# Patient Record
Sex: Female | Born: 1980 | Hispanic: No | Marital: Single | Smoking: Never smoker
Health system: Southern US, Community
[De-identification: ages and names within clinical notes are randomized; demographics above are authoritative.]

## PROBLEM LIST (undated history)

## (undated) DIAGNOSIS — F329 Major depressive disorder, single episode, unspecified: Secondary | ICD-10-CM

## (undated) DIAGNOSIS — F419 Anxiety disorder, unspecified: Secondary | ICD-10-CM

## (undated) DIAGNOSIS — F32A Depression, unspecified: Secondary | ICD-10-CM

## (undated) DIAGNOSIS — F909 Attention-deficit hyperactivity disorder, unspecified type: Secondary | ICD-10-CM

## (undated) HISTORY — DX: Anxiety disorder, unspecified: F41.9

## (undated) HISTORY — PX: WISDOM TOOTH EXTRACTION: SHX21

---

## 2005-08-30 ENCOUNTER — Ambulatory Visit: Payer: Self-pay | Admitting: Sports Medicine

## 2005-09-20 ENCOUNTER — Ambulatory Visit: Payer: Self-pay | Admitting: Sports Medicine

## 2005-11-22 ENCOUNTER — Ambulatory Visit: Payer: Self-pay | Admitting: Sports Medicine

## 2006-06-02 ENCOUNTER — Encounter (INDEPENDENT_AMBULATORY_CARE_PROVIDER_SITE_OTHER): Payer: Self-pay | Admitting: Family Medicine

## 2006-06-02 ENCOUNTER — Ambulatory Visit: Payer: Self-pay | Admitting: Sports Medicine

## 2006-06-02 ENCOUNTER — Telehealth: Payer: Self-pay | Admitting: *Deleted

## 2006-06-02 DIAGNOSIS — J029 Acute pharyngitis, unspecified: Secondary | ICD-10-CM | POA: Insufficient documentation

## 2006-06-02 DIAGNOSIS — M62838 Other muscle spasm: Secondary | ICD-10-CM | POA: Insufficient documentation

## 2006-06-02 LAB — CONVERTED CEMR LAB: Rapid Strep: NEGATIVE

## 2006-07-04 ENCOUNTER — Ambulatory Visit: Payer: Self-pay | Admitting: Sports Medicine

## 2006-07-04 DIAGNOSIS — M6740 Ganglion, unspecified site: Secondary | ICD-10-CM | POA: Insufficient documentation

## 2006-07-04 DIAGNOSIS — M79609 Pain in unspecified limb: Secondary | ICD-10-CM | POA: Insufficient documentation

## 2006-07-04 DIAGNOSIS — M2140 Flat foot [pes planus] (acquired), unspecified foot: Secondary | ICD-10-CM | POA: Insufficient documentation

## 2006-07-04 DIAGNOSIS — M25579 Pain in unspecified ankle and joints of unspecified foot: Secondary | ICD-10-CM | POA: Insufficient documentation

## 2006-07-08 ENCOUNTER — Telehealth: Payer: Self-pay | Admitting: Sports Medicine

## 2006-07-09 ENCOUNTER — Telehealth: Payer: Self-pay | Admitting: *Deleted

## 2006-07-18 ENCOUNTER — Ambulatory Visit: Payer: Self-pay | Admitting: Sports Medicine

## 2006-07-18 DIAGNOSIS — R234 Changes in skin texture: Secondary | ICD-10-CM | POA: Insufficient documentation

## 2006-07-18 LAB — CONVERTED CEMR LAB
Albumin: 4.4 g/dL (ref 3.5–5.2)
Total Bilirubin: 0.4 mg/dL (ref 0.3–1.2)

## 2006-10-31 ENCOUNTER — Encounter: Admission: RE | Admit: 2006-10-31 | Discharge: 2006-10-31 | Payer: Self-pay | Admitting: Family Medicine

## 2006-11-07 ENCOUNTER — Ambulatory Visit: Payer: Self-pay | Admitting: Sports Medicine

## 2006-11-07 DIAGNOSIS — M84369A Stress fracture, unspecified tibia and fibula, initial encounter for fracture: Secondary | ICD-10-CM | POA: Insufficient documentation

## 2006-11-20 ENCOUNTER — Other Ambulatory Visit: Admission: RE | Admit: 2006-11-20 | Discharge: 2006-11-20 | Payer: Self-pay | Admitting: Family Medicine

## 2006-11-21 ENCOUNTER — Ambulatory Visit: Payer: Self-pay | Admitting: Sports Medicine

## 2007-08-04 ENCOUNTER — Ambulatory Visit: Payer: Self-pay | Admitting: Sports Medicine

## 2007-08-04 DIAGNOSIS — M25569 Pain in unspecified knee: Secondary | ICD-10-CM | POA: Insufficient documentation

## 2007-11-13 ENCOUNTER — Other Ambulatory Visit: Admission: RE | Admit: 2007-11-13 | Discharge: 2007-11-13 | Payer: Self-pay | Admitting: Family Medicine

## 2007-12-22 ENCOUNTER — Emergency Department (HOSPITAL_COMMUNITY): Admission: EM | Admit: 2007-12-22 | Discharge: 2007-12-22 | Payer: Self-pay | Admitting: Emergency Medicine

## 2008-11-05 ENCOUNTER — Encounter (INDEPENDENT_AMBULATORY_CARE_PROVIDER_SITE_OTHER): Payer: Self-pay | Admitting: *Deleted

## 2008-11-05 DIAGNOSIS — F17200 Nicotine dependence, unspecified, uncomplicated: Secondary | ICD-10-CM | POA: Insufficient documentation

## 2008-11-18 ENCOUNTER — Other Ambulatory Visit: Admission: RE | Admit: 2008-11-18 | Discharge: 2008-11-18 | Payer: Self-pay | Admitting: Family Medicine

## 2009-12-12 ENCOUNTER — Other Ambulatory Visit: Admission: RE | Admit: 2009-12-12 | Discharge: 2009-12-12 | Payer: Self-pay | Admitting: Family Medicine

## 2010-03-27 NOTE — Assessment & Plan Note (Signed)
Summary: shin pain despite orthotic changes last visit   Vital Signs:  Patient Profile:   30 Years Old Female Pulse rate:   60 / minute BP sitting:   112 / 65  Vitals Entered By: Lillia Pauls CMA (Jul 18, 2006 10:24 AM)               Chief Complaint:  shin pain despite orthotic changes last visit.  History of Present Illness: Since the corrections to her orthrotics, the pain along the medial border of the anterior tibia has worsened.  She also reports pain with plantarflexion in the area of the medial soleus.    Runs approximately 7 miles tow to three times a week.  Is now having pain in these areas within 2 minutes of running.   However, she denies any paresthesias, numbness, or weakness in the distal right leg.   The pain is described as a dull ache that progresses in intensity with weight bearing exercise.  Note the only change in training was some increase in her running frequiency;  seh does spin with lisa barella and does a number of other exercise regimens including some weights at times.        Physical Exam  General:     Knee with mild genu valgus.   Foot is average arch with some slight breakdown of longitudinal arch bilat.   transverse arch collapse is significant bilat with bilateral bunionettes.   Enlargement of MTP bilat without clear Bunions at this stage but bilaterally with callous on l. medial great toe.   small ganglion in extensor hallicus tendon over mcp-- nt and mobile.   No pain with great toe range of motion.  Gait:overpronation with excessive great toe pushoff.  this is exacerbated by turnout of Rt foot which causes more pronation on RT.  walking gait was fairly neutral.  Examination of the right shin shows tenderness to palpation at the junction of the lower third of the right tibia.  She also has pain with resisted plantarflexion and tenderness to palpation at the medial border of the right soleus.  MS [place] scan of Rt tibia did not show any  step off or fracture line.  MM and tendons appear intact along medial shin. There was 1 small area of periosteal thickening at tender area at juncture of distal and medial 1/3 of tibia on Rt       Impression & Recommendations:  Problem # 1:  LEG PAIN (ICD-729.5) Assessment: Unchanged This could be multifactorial shin splints vs tibial stress reaction vs soleus syndrome.  Will treat as shin spints with rest and rehab exercises as outlined in the instruction section.  If pain worsens, will get bone scan to rule stress fracture.  Also believe that the pain worsened due to too many corrections at one time in the previous orthotics.  Removed the large metatarsal pad and grinded down the post on the great toe bilaterally. Orders: [facility]- Est Level  3 (96295)   Other Orders: [facility] ([id number])   Patient Instructions: 1)  Do 3 sets of 15 of the following exercises everyday 2)  a) calf raises on a step 3)  b) internally rotated calf raises on a step 4)  c) big toe calf raises on a step 5)  ice the shin everyday, avoid running for the next 2 weeks 6)  do running lunges with 10 lb weight 7)  do half squats (down fast and up slow) 8)  after 2 weeks, if better, resume  running but decrease distance by 25% and slowly resume.
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
## 2013-10-07 ENCOUNTER — Other Ambulatory Visit: Payer: Self-pay | Admitting: Family Medicine

## 2013-10-07 DIAGNOSIS — N644 Mastodynia: Secondary | ICD-10-CM

## 2013-10-12 ENCOUNTER — Ambulatory Visit
Admission: RE | Admit: 2013-10-12 | Discharge: 2013-10-12 | Disposition: A | Discharge status: Home / Self Care | Payer: 59 | Source: Ambulatory Visit | Attending: Family Medicine | Admitting: Family Medicine

## 2013-10-12 DIAGNOSIS — N644 Mastodynia: Secondary | ICD-10-CM

## 2014-06-22 ENCOUNTER — Encounter: Payer: Self-pay | Admitting: Sports Medicine

## 2014-06-22 ENCOUNTER — Ambulatory Visit (INDEPENDENT_AMBULATORY_CARE_PROVIDER_SITE_OTHER): Payer: 59 | Admitting: Sports Medicine

## 2014-06-22 VITALS — BP 117/63 | Ht 65.0 in | Wt 145.0 lb

## 2014-06-22 DIAGNOSIS — M25561 Pain in right knee: Secondary | ICD-10-CM | POA: Diagnosis not present

## 2014-06-22 DIAGNOSIS — M79604 Pain in right leg: Secondary | ICD-10-CM | POA: Diagnosis not present

## 2014-06-22 NOTE — Assessment & Plan Note (Addendum)
Pes anserine region. US with hypoechoic change in Semi-T and Gracilis. Weak hip adductors on right. RLE > LLE by ~0.5cm. -Hip adductor strengthening on the right -Obtain updated cushioned footwear. -Follow-up in 6-8 weeks to check strength or sooner prn.

## 2014-06-22 NOTE — Assessment & Plan Note (Signed)
Right distal 1/3 tibial stress reaction. -Obtain updated cushioned footwear for running. -Follow-up 6-8 weeks or sooner prn
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
## 2014-07-27 ENCOUNTER — Other Ambulatory Visit: Payer: Self-pay | Admitting: Obstetrics and Gynecology

## 2014-07-28 LAB — CYTOLOGY - PAP

## 2014-08-03 ENCOUNTER — Ambulatory Visit: Payer: 59 | Admitting: Sports Medicine

## 2014-08-16 ENCOUNTER — Other Ambulatory Visit: Payer: Self-pay | Admitting: Obstetrics and Gynecology

## 2015-09-06 ENCOUNTER — Encounter: Payer: Self-pay | Admitting: Sports Medicine

## 2015-09-06 ENCOUNTER — Ambulatory Visit (INDEPENDENT_AMBULATORY_CARE_PROVIDER_SITE_OTHER): Payer: 59 | Admitting: Sports Medicine

## 2015-09-06 VITALS — BP 120/70 | Ht 65.0 in | Wt 145.0 lb

## 2015-09-06 DIAGNOSIS — M533 Sacrococcygeal disorders, not elsewhere classified: Secondary | ICD-10-CM | POA: Insufficient documentation

## 2015-09-06 NOTE — Patient Instructions (Signed)
Trunk Exercises  Lateral leans Forward rotation and dip toward knee Side planks  Hips  Keep up some abduction  Some adduction Work in the step exercises  Topical for SI Joint I like arnica gel  Ok to use aleve if needed  You have joint mice!

## 2015-09-06 NOTE — Progress Notes (Signed)
Patient ID: Oletta Dartermanda Gravatt, female   DOB: 05-May-1980, 35 y.o.   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

## 2015-09-06 NOTE — Assessment & Plan Note (Signed)
We will use a conservative program  Focus on trunk MM stabilization since hip MM seem strong  Give this 6 to 8 weeks to see if we can tell if she is having progress  Reassess if not

## 2016-01-20 ENCOUNTER — Encounter (HOSPITAL_COMMUNITY): Payer: Self-pay | Admitting: *Deleted

## 2016-01-20 ENCOUNTER — Emergency Department (HOSPITAL_COMMUNITY): Payer: Worker's Compensation

## 2016-01-20 ENCOUNTER — Emergency Department (HOSPITAL_COMMUNITY)
Admission: EM | Admit: 2016-01-20 | Discharge: 2016-01-20 | Disposition: A | Discharge status: Home / Self Care | Payer: Worker's Compensation | Attending: Emergency Medicine | Admitting: Emergency Medicine

## 2016-01-20 DIAGNOSIS — S60410A Abrasion of right index finger, initial encounter: Secondary | ICD-10-CM | POA: Diagnosis not present

## 2016-01-20 DIAGNOSIS — S60052A Contusion of left little finger without damage to nail, initial encounter: Secondary | ICD-10-CM | POA: Insufficient documentation

## 2016-01-20 DIAGNOSIS — T148XXA Other injury of unspecified body region, initial encounter: Secondary | ICD-10-CM

## 2016-01-20 DIAGNOSIS — F909 Attention-deficit hyperactivity disorder, unspecified type: Secondary | ICD-10-CM | POA: Insufficient documentation

## 2016-01-20 DIAGNOSIS — Y939 Activity, unspecified: Secondary | ICD-10-CM | POA: Insufficient documentation

## 2016-01-20 DIAGNOSIS — S60222A Contusion of left hand, initial encounter: Secondary | ICD-10-CM

## 2016-01-20 DIAGNOSIS — Y990 Civilian activity done for income or pay: Secondary | ICD-10-CM | POA: Insufficient documentation

## 2016-01-20 DIAGNOSIS — W228XXA Striking against or struck by other objects, initial encounter: Secondary | ICD-10-CM | POA: Diagnosis not present

## 2016-01-20 DIAGNOSIS — S6992XA Unspecified injury of left wrist, hand and finger(s), initial encounter: Secondary | ICD-10-CM | POA: Diagnosis present

## 2016-01-20 DIAGNOSIS — Y9239 Other specified sports and athletic area as the place of occurrence of the external cause: Secondary | ICD-10-CM | POA: Diagnosis not present

## 2016-01-20 HISTORY — DX: Depression, unspecified: F32.A

## 2016-01-20 HISTORY — DX: Attention-deficit hyperactivity disorder, unspecified type: F90.9

## 2016-01-20 HISTORY — DX: Major depressive disorder, single episode, unspecified: F32.9

## 2016-01-20 NOTE — ED Triage Notes (Signed)
Patient is alert and oriented x4.  She is complaining of a laceration to the right index finger with a secondary injury to the left hand while working out at the gym.  Currently she rates her pain 3 of 10.

## 2016-01-20 NOTE — Discharge Instructions (Signed)
Please read attached information. If you experience any new or worsening signs or symptoms please return to the emergency room for evaluation. Please follow-up with your primary care provider or specialist as discussed.  °

## 2016-01-20 NOTE — ED Notes (Signed)
Patient is alert and oriented x3.  She was given DC instructions and follow up visit instructions.  Patient gave verbal understanding. She was DC ambulatory under her own power to home.  V/S stable.  He was not showing any signs of distress on DC 
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
## 2016-01-20 NOTE — ED Notes (Signed)
Patient has a small skin abrasion to the right index finger.   band aide placed over abrasion.  No bleeding noted

## 2016-12-27 ENCOUNTER — Ambulatory Visit: Payer: Self-pay | Admitting: Physician Assistant

## 2016-12-28 ENCOUNTER — Ambulatory Visit (INDEPENDENT_AMBULATORY_CARE_PROVIDER_SITE_OTHER): Payer: Worker's Compensation | Admitting: Family Medicine

## 2016-12-28 ENCOUNTER — Ambulatory Visit (INDEPENDENT_AMBULATORY_CARE_PROVIDER_SITE_OTHER): Payer: Worker's Compensation

## 2016-12-28 ENCOUNTER — Encounter: Payer: Self-pay | Admitting: Family Medicine

## 2016-12-28 VITALS — BP 122/68 | HR 85 | Temp 98.3°F | Resp 16 | Ht 65.35 in | Wt 138.0 lb

## 2016-12-28 DIAGNOSIS — Y990 Civilian activity done for income or pay: Secondary | ICD-10-CM

## 2016-12-28 DIAGNOSIS — S93601A Unspecified sprain of right foot, initial encounter: Secondary | ICD-10-CM | POA: Diagnosis not present

## 2016-12-28 DIAGNOSIS — M79671 Pain in right foot: Secondary | ICD-10-CM | POA: Diagnosis not present

## 2016-12-28 DIAGNOSIS — S99921A Unspecified injury of right foot, initial encounter: Secondary | ICD-10-CM

## 2016-12-28 NOTE — Patient Instructions (Signed)
     IF you received an x-ray today, you will receive an invoice from [facility]. Please contact [facility] at [phone] with questions or concerns regarding your invoice.   IF you received labwork today, you will receive an invoice from [facility]. Please contact [facility] at [phone] with questions or concerns regarding your invoice.   Our billing staff will not be able to assist you with questions regarding bills from these companies.  You will be contacted with the lab results as soon as they are available. The fastest way to get your results is to activate your My Chart account. Instructions are located on the last page of this paperwork. If you have not heard from us regarding the results in 2 weeks, please contact this office.     

## 2016-12-28 NOTE — Progress Notes (Signed)
Subjective:     Patient ID: Kathy Vaughan, female   DOB: [birth year], 36 y.o.   MRN: [id number]  HPI This 36 y.o. female presents for RIGHT FOOT injury at work on 12/26/2016.  Stepped back onto equipment at employment and twisted right foot.  Having diffuse right foot pain with swelling and bruising.  Pain with weightbearing.  Has identified a specific shoe that is tolerable to wear during work hours.  Denies numbness, tingling, weakness of foot.  Has been icing the foot and elevating it.  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  Usually jogs every day after work on her personal time.  No previous injury to the foot.  Iced and elevated; resting; sitting as much as possible; compression.   No Aleve.  Review of Systems  Constitutional: Negative for chills, diaphoresis, fatigue and fever.  HENT: Negative for ear pain, postnasal drip, rhinorrhea, sinus pressure, sore throat and trouble swallowing.   Respiratory: Negative for cough and shortness of breath.   Cardiovascular: Negative for chest pain, palpitations and leg swelling.  Gastrointestinal: Negative for abdominal pain, constipation, diarrhea, nausea and vomiting.  Musculoskeletal: Positive for arthralgias, gait problem and joint swelling.  Neurological: Negative for weakness and numbness.       Objective:   Physical Exam  Constitutional: She is oriented to person, place, and time. She appears well-developed and well-nourished. No distress.  HENT:  Head: Normocephalic and atraumatic.  Eyes: Pupils are equal, round, and reactive to light. Conjunctivae are normal.  Neck: Normal range of motion. Neck supple.  Cardiovascular: Normal rate, regular rhythm and normal heart sounds.  Exam reveals no gallop and no friction rub.   No murmur heard. Pulmonary/Chest: Effort normal and breath sounds normal. She has no wheezes. She has no rales.  Musculoskeletal:       Right ankle: She exhibits  normal range of motion, no swelling and no ecchymosis. No tenderness. No lateral malleolus, no medial malleolus and no AITFL tenderness found. Achilles tendon normal. Achilles tendon exhibits no pain, no defect and normal Thompson's test results.       Right lower leg: Normal. She exhibits no tenderness, no bony tenderness and no swelling.       Right foot: There is tenderness, bony tenderness and swelling. There is normal range of motion, normal capillary refill, no crepitus, no deformity and no laceration.  Mild swelling of proximal foot RIGHT.  Painful ROM in all directions.  Neurological: She is alert and oriented to person, place, and time.  Skin: She is not diaphoretic.  Psychiatric: She has a normal mood and affect. Her behavior is normal.  Nursing note and vitals reviewed.  No results found.    Assessment:     1. Right foot pain   2. Sprain of right foot, initial encounter   3. Foot injury, right, initial encounter   4. Work related injury        Plan:   New work-related injury. Recommend rest, icing, elevation foot. Recommend supportive tennis shoe while working. Avoid prolonged standing on right foot for the next week.  Avoid jogging, jumping on right foot for the next week. Light duty recommended with specific restrictions outlined in note. X-ray negative for acute fracture. Return to clinic in 2 weeks for reevaluation and sooner if symptoms worsen.     Orders Placed This Encounter  Procedures  . DG Foot Complete Right    No orders of the  defined types were placed in this encounter.   Marlynn Hinckley Paulita FujitaMartin Delora Gravatt, M.D. [facility] at [facility]  [place] [facility] [address] [place], [place]  [id number] [phone] phone [phone] fax

## 2017-01-13 ENCOUNTER — Other Ambulatory Visit: Payer: Self-pay

## 2017-01-13 ENCOUNTER — Encounter: Payer: Self-pay | Admitting: Family Medicine

## 2017-01-13 ENCOUNTER — Ambulatory Visit (INDEPENDENT_AMBULATORY_CARE_PROVIDER_SITE_OTHER): Payer: Worker's Compensation | Admitting: Family Medicine

## 2017-01-13 VITALS — BP 118/64 | HR 94 | Temp 98.4°F | Resp 16 | Ht 66.14 in | Wt 135.0 lb

## 2017-01-13 DIAGNOSIS — Y990 Civilian activity done for income or pay: Secondary | ICD-10-CM | POA: Diagnosis not present

## 2017-01-13 DIAGNOSIS — S9031XD Contusion of right foot, subsequent encounter: Secondary | ICD-10-CM | POA: Diagnosis not present

## 2017-01-13 NOTE — Patient Instructions (Signed)
     IF you received an x-ray today, you will receive an invoice from [facility]. Please contact [facility] at [phone] with questions or concerns regarding your invoice.   IF you received labwork today, you will receive an invoice from [facility]. Please contact [facility] at [phone] with questions or concerns regarding your invoice.   Our billing staff will not be able to assist you with questions regarding bills from these companies.  You will be contacted with the lab results as soon as they are available. The fastest way to get your results is to activate your My Chart account. Instructions are located on the last page of this paperwork. If you have not heard from us regarding the results in 2 weeks, please contact this office.     

## 2017-01-13 NOTE — Progress Notes (Signed)
   Subjective:    Patient ID: Kathy Vaughan, female    DOB: [birth year], 36 y.o.   MRN: [id number]  01/13/2017  Work Related Injury (follow-up, right foot injury )    HPI This 36 y.o. female presents for evaluation of R foot sprain.  Foot feels much better.  85% improved from last visit.  Has increased activity gradually with good tolerance.  No persistent swelling of foot. No bruising.  Feeling well.  No numbness/tingling/weakness of foot.  Gait is normal.  Ready to return to regular duty at work.    BP Readings from Last 3 Encounters:  01/13/17 118/64  12/28/16 122/68  01/20/16 111/80     There is no immunization history on file for this patient.  Review of Systems  Constitutional: Negative for chills, diaphoresis, fatigue, fever and unexpected weight change.  Musculoskeletal: Negative for arthralgias and joint swelling.  Neurological: Negative for weakness and numbness.    No Known Allergies   No family history on file.     Objective:    BP 118/64   Pulse 94   Temp 98.4 F (36.9 C) (Oral)   Resp 16   Ht 5' 6.14" (1.68 m)   Wt 135 lb (61.2 kg)   SpO2 100%   BMI 21.70 kg/m  Physical Exam  Constitutional: She is oriented to person, place, and time. She appears well-developed and well-nourished. No distress.  HENT:  Head: Normocephalic and atraumatic.  Eyes: Conjunctivae are normal. Pupils are equal, round, and reactive to light.  Neck: Normal range of motion. Neck supple.  Cardiovascular: Normal rate, regular rhythm and normal heart sounds. Exam reveals no gallop and no friction rub.  No murmur heard. Pulmonary/Chest: Effort normal and breath sounds normal. She has no wheezes. She has no rales.  Musculoskeletal:       Right ankle: Normal. She exhibits normal range of motion and no swelling. No tenderness. No lateral malleolus and no medial malleolus tenderness found. Achilles tendon normal. Achilles tendon exhibits no pain.       Right lower leg: Normal. She  exhibits no tenderness, no bony tenderness and no swelling.       Right foot: Normal. There is normal range of motion, no tenderness, no bony tenderness and no swelling.  Neurological: She is alert and oriented to person, place, and time.  Skin: She is not diaphoretic.  Psychiatric: She has a normal mood and affect. Her behavior is normal.  Nursing note and vitals reviewed.  No results found.        Assessment & Plan:   1. Contusion of right foot, subsequent encounter   2. Work related injury    -much improved and nearly resolved -return to regular duty. -follow-up PRN.   No orders of the defined types were placed in this encounter.  No orders of the defined types were placed in this encounter.   No Follow-up on file.   Tasheem Elms Paulita FujitaMartin Ellaree Gear, M.D. Primary Care at [facility] [address] [place], [place]  [id number] [phone] phone [phone] fax

## 2017-03-27 ENCOUNTER — Telehealth: Payer: Self-pay

## 2017-03-27 NOTE — Telephone Encounter (Signed)
Pt needs office visit for referral.   Copied from CRM [phone]. Topic: Referral - Request >> Mar 27, 2017 12:27 PM Landry MellowFoltz, Melissa J wrote: Reason for CRM: pt is calling to request rheumatologist.  She would like  to go to [place] [facility], Dr Orlin HildingAngela Hawks Cb # [phone]

## 2017-03-28 ENCOUNTER — Telehealth: Payer: Self-pay | Admitting: Family Medicine

## 2017-03-28 NOTE — Telephone Encounter (Signed)
Called pt to let her kow she will need an OV for a referral. She is going to call her PCP to schedule with them. If something doesn't work out there, she will call us back.  Thanks!

## 2017-03-31 ENCOUNTER — Other Ambulatory Visit: Payer: Self-pay | Admitting: Family Medicine

## 2017-03-31 ENCOUNTER — Ambulatory Visit
Admission: RE | Admit: 2017-03-31 | Discharge: 2017-03-31 | Disposition: A | Discharge status: Home / Self Care | Payer: BLUE CROSS/BLUE SHIELD | Source: Ambulatory Visit | Attending: Family Medicine | Admitting: Family Medicine

## 2017-03-31 DIAGNOSIS — R609 Edema, unspecified: Secondary | ICD-10-CM

## 2017-03-31 DIAGNOSIS — M7989 Other specified soft tissue disorders: Secondary | ICD-10-CM | POA: Diagnosis not present

## 2017-03-31 DIAGNOSIS — M2550 Pain in unspecified joint: Secondary | ICD-10-CM | POA: Diagnosis not present

## 2017-04-28 DIAGNOSIS — F3341 Major depressive disorder, recurrent, in partial remission: Secondary | ICD-10-CM | POA: Diagnosis not present

## 2017-04-28 DIAGNOSIS — F900 Attention-deficit hyperactivity disorder, predominantly inattentive type: Secondary | ICD-10-CM | POA: Diagnosis not present

## 2017-05-22 DIAGNOSIS — M545 Low back pain: Secondary | ICD-10-CM | POA: Diagnosis not present

## 2017-05-22 DIAGNOSIS — M2550 Pain in unspecified joint: Secondary | ICD-10-CM | POA: Diagnosis not present

## 2017-05-22 DIAGNOSIS — M064 Inflammatory polyarthropathy: Secondary | ICD-10-CM | POA: Diagnosis not present

## 2017-05-28 ENCOUNTER — Encounter: Payer: Self-pay | Admitting: Physician Assistant

## 2017-06-25 DIAGNOSIS — Z01419 Encounter for gynecological examination (general) (routine) without abnormal findings: Secondary | ICD-10-CM | POA: Diagnosis not present

## 2017-06-25 DIAGNOSIS — Z6822 Body mass index (BMI) 22.0-22.9, adult: Secondary | ICD-10-CM | POA: Diagnosis not present

## 2017-06-25 DIAGNOSIS — R1903 Right lower quadrant abdominal swelling, mass and lump: Secondary | ICD-10-CM | POA: Diagnosis not present

## 2017-07-01 DIAGNOSIS — R768 Other specified abnormal immunological findings in serum: Secondary | ICD-10-CM | POA: Diagnosis not present

## 2017-07-01 DIAGNOSIS — M545 Low back pain: Secondary | ICD-10-CM | POA: Diagnosis not present

## 2017-07-01 DIAGNOSIS — M2550 Pain in unspecified joint: Secondary | ICD-10-CM | POA: Diagnosis not present

## 2017-07-07 DIAGNOSIS — M898X7 Other specified disorders of bone, ankle and foot: Secondary | ICD-10-CM | POA: Diagnosis not present

## 2017-07-07 DIAGNOSIS — M21611 Bunion of right foot: Secondary | ICD-10-CM | POA: Diagnosis not present

## 2017-07-07 DIAGNOSIS — M21612 Bunion of left foot: Secondary | ICD-10-CM | POA: Diagnosis not present

## 2017-07-07 DIAGNOSIS — R2689 Other abnormalities of gait and mobility: Secondary | ICD-10-CM | POA: Diagnosis not present

## 2017-07-17 ENCOUNTER — Encounter: Payer: Self-pay | Admitting: Family Medicine

## 2017-10-21 DIAGNOSIS — F3341 Major depressive disorder, recurrent, in partial remission: Secondary | ICD-10-CM | POA: Diagnosis not present

## 2017-10-21 DIAGNOSIS — F900 Attention-deficit hyperactivity disorder, predominantly inattentive type: Secondary | ICD-10-CM | POA: Diagnosis not present

## 2018-01-18 DIAGNOSIS — L03039 Cellulitis of unspecified toe: Secondary | ICD-10-CM | POA: Diagnosis not present

## 2018-04-13 DIAGNOSIS — F3341 Major depressive disorder, recurrent, in partial remission: Secondary | ICD-10-CM | POA: Diagnosis not present

## 2018-04-13 DIAGNOSIS — F900 Attention-deficit hyperactivity disorder, predominantly inattentive type: Secondary | ICD-10-CM | POA: Diagnosis not present

## 2018-05-03 DIAGNOSIS — J029 Acute pharyngitis, unspecified: Secondary | ICD-10-CM | POA: Diagnosis not present

## 2018-05-03 DIAGNOSIS — R05 Cough: Secondary | ICD-10-CM | POA: Diagnosis not present

## 2018-05-10 DIAGNOSIS — R05 Cough: Secondary | ICD-10-CM | POA: Diagnosis not present

## 2018-05-10 DIAGNOSIS — J069 Acute upper respiratory infection, unspecified: Secondary | ICD-10-CM | POA: Diagnosis not present

## 2018-10-05 DIAGNOSIS — F3341 Major depressive disorder, recurrent, in partial remission: Secondary | ICD-10-CM | POA: Diagnosis not present

## 2018-10-18 DIAGNOSIS — R51 Headache: Secondary | ICD-10-CM | POA: Diagnosis not present

## 2018-10-18 DIAGNOSIS — Z1159 Encounter for screening for other viral diseases: Secondary | ICD-10-CM | POA: Diagnosis not present

## 2018-10-18 DIAGNOSIS — J029 Acute pharyngitis, unspecified: Secondary | ICD-10-CM | POA: Diagnosis not present

## 2018-11-06 DIAGNOSIS — R109 Unspecified abdominal pain: Secondary | ICD-10-CM | POA: Diagnosis not present

## 2019-01-29 ENCOUNTER — Ambulatory Visit: Payer: BC Managed Care – PPO | Admitting: Pediatrics

## 2019-01-29 ENCOUNTER — Other Ambulatory Visit: Payer: Self-pay

## 2019-01-29 VITALS — BP 120/78 | Ht 65.0 in | Wt 132.0 lb

## 2019-01-29 DIAGNOSIS — S99921A Unspecified injury of right foot, initial encounter: Secondary | ICD-10-CM

## 2019-01-29 NOTE — Progress Notes (Signed)
  Kathy Vaughan - 38 y.o. 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 She reports that she was trail running yesterday in minimalist shoes and rolled over a rock, immediately felt pain in her 2nd metatarsal. She has had bruising and swelling over the 2nd MT of her foot. She has compressed and elevated foot. Takes celebrex as well. Pain with weight bearing.   ROS: No instability, muscle pain, numbness/tingling, redness, otherwise see HPI   PMHx - Updated and reviewed.  Contributory factors include: Negative PSHx - Updated and reviewed.  Contributory factors include:  Negative FHx - Updated and reviewed.  Contributory factors include:  Negative Social Hx - Updated and reviewed. Contributory factors include: Negative Medications - reviewed   DATA REVIEWED: none  PHYSICAL EXAM:  VS: BP:120/78  HR: bpm  TEMP: ( )  RESP:   HT:5\' 5"  (165.1 cm)   WT:132 lb (59.9 kg)  BMI:21.97 PHYSICAL EXAM: Gen: NAD, alert, cooperative with exam, well-appearing HEENT: clear conjunctiva,  CV:  no edema, capillary refill brisk, normal rate Resp: non-labored Skin: no rashes, normal turgor  Neuro: no gross deficits.  Psych:  alert and oriented  Right foot: Foot: Inspection:  Swelling, bruising, loss of landmarks over 2nd MT Palpation: TTP over 2nd MT ROM: Full ROM of the ankle. Pain with flexion of 2nd toe. Normal midfoot flexibility Strength: 5/5 strength ankle in all planes Neurovascular: N/V intact distally in the lower extremity  Pain during pushoff phase of walking, unable to bear weight   Left foot: No deformity noted No TTP Full ROM Full strength in all planes NVI  [place]: right foot Distal 2nd MT with neovascularization and fluid cap over area of maximal tenderness. No cortical irregularity noted. Hypoechoic changes in 2nd MTP as well.  Impression: swelling and  neovascularization suggestive of 2nd MT stress fracture  ASSESSMENT & PLAN:   38 yo presenting with foot pain after running injury yesterday. Exam with swelling, bruising over distal 2nd MT with ultrasound findings of swelling and neovascularizationsuggestive of possible metatarsal fracture although there is no cortical irregularity noted. Will place in post-op shoe with instructions to rest for 2 weeks and return for repeat imaging. Instructed to refrain from running or exercises such as planks, lunges, pushups that cause hyperextension of toe. May use voltaren gel, ice as needed.  I was the preceptor for this visit and available for immediate consultation Shellia Cleverly, DO

## 2019-02-02 ENCOUNTER — Other Ambulatory Visit: Payer: Self-pay | Admitting: *Deleted

## 2019-02-08 DIAGNOSIS — Z01419 Encounter for gynecological examination (general) (routine) without abnormal findings: Secondary | ICD-10-CM | POA: Diagnosis not present

## 2019-02-08 DIAGNOSIS — Z6822 Body mass index (BMI) 22.0-22.9, adult: Secondary | ICD-10-CM | POA: Diagnosis not present

## 2019-02-11 ENCOUNTER — Ambulatory Visit: Payer: BC Managed Care – PPO | Admitting: Pediatrics

## 2019-02-11 ENCOUNTER — Other Ambulatory Visit: Payer: Self-pay

## 2019-02-11 VITALS — BP 110/68 | Ht 65.0 in | Wt 132.0 lb

## 2019-02-11 DIAGNOSIS — S99921D Unspecified injury of right foot, subsequent encounter: Secondary | ICD-10-CM

## 2019-02-11 NOTE — Progress Notes (Signed)
  Kathy Vaughan - 38 y.o. 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 She was seen last on 12/4, had US findings consistent with 2nd metarsal fracture. She reports that she had initial improvement with rest and walking in post-op shoe. The past 4 days, she has not been wearing the post op shoe as often since she has been home and pain has worsened. She is still using voltaren with slight improvement in pain. Swelling and bruising has improved. She has been doing weight lifting, pilates. No running.  ROS: No instability, muscle pain, numbness/tingling, redness, otherwise see HPI   PMHx - Updated and reviewed.  Contributory factors include: Negative PSHx - Updated and reviewed.  Contributory factors include:  Negative FHx - Updated and reviewed.  Contributory factors include:  Negative Social Hx - Updated and reviewed. Contributory factors include: Negative Medications - reviewed   DATA REVIEWED: none  PHYSICAL EXAM:  VS: BP:110/68  HR: bpm  TEMP: ( )  RESP:   HT:5\' 5"  (165.1 cm)   WT:132 lb (59.9 kg)  BMI:21.97 PHYSICAL EXAM: Gen: NAD, alert, cooperative with exam, well-appearing HEENT: clear conjunctiva,  CV:  no edema, capillary refill brisk, normal rate Resp: non-labored Skin: no rashes, normal turgor  Neuro: no gross deficits.  Psych:  alert and oriented  Right foot: Foot: Inspection:  Mild swelling over 2nd MT Palpation: TTP over 2nd and 3rd MT; more proximal than last examination ROM: Full ROM of the ankle. Able to flex and extend 2nd toe. Normal midfoot flexibility Strength: 5/5 strength ankle in all planes Neurovascular: N/V intact distally in the lower extremity   Left foot: No deformity noted No TTP Full ROM Full strength in all planes NVI  [place]: right foot Distal 2nd MT area of calcification and  fluid cap noted. Under new area of maximal tenderness, she has some neovascularization but no cortical irregularity or hypoechoic changes   Impression: healing 2nd MT stress fracture, inflammation in cortex of 3rd MT as well consistent with stress reaction  ASSESSMENT & PLAN:   38 yo presenting for 2 week follow up after right foot 2nd MT stress fracture. She has notable ossification on [place] as a good sign of healing. Pain has generally improved with rest from painful activity and post-op shoe. She will continue in post-op shoe until pain improves and then may transition to well cushioned shoe. May do stationary bike with instructions to use discretion as pain allows. Will re-evaluate in 3 weeks.   I was the preceptor for this visit and available for immediate consultation. Shellia Cleverly, DO

## 2019-03-11 ENCOUNTER — Ambulatory Visit (INDEPENDENT_AMBULATORY_CARE_PROVIDER_SITE_OTHER): Payer: BC Managed Care – PPO | Admitting: Pediatrics

## 2019-03-11 ENCOUNTER — Other Ambulatory Visit: Payer: Self-pay

## 2019-03-11 VITALS — BP 114/82 | Ht 65.0 in | Wt 135.0 lb

## 2019-03-11 DIAGNOSIS — S99921D Unspecified injury of right foot, subsequent encounter: Secondary | ICD-10-CM

## 2019-03-11 NOTE — Progress Notes (Signed)
  Hanni Milford - 39 y.o. 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 She was seen last on 12/17, had US findings consistent with 2nd metarsal fracture. She reports that she had had continued improvement, has been out of post-op shoe for the past two weeks. She is wearing hiking boots and shoes with stiff soles. Yesterday, she walked her dog in tennis shoes and also taught a new class and she is having more foot pain and swelling today. She has not been running yet.  ROS: No instability, muscle pain, numbness/tingling, redness, otherwise see HPI   PMHx - Updated and reviewed.  Contributory factors include: Negative PSHx - Updated and reviewed.  Contributory factors include:  Negative FHx - Updated and reviewed.  Contributory factors include:  Negative Social Hx - Updated and reviewed. Contributory factors include: Negative Medications - reviewed   DATA REVIEWED: none  PHYSICAL EXAM:  VS: BP:114/82  HR: bpm  TEMP: ( )  RESP:   HT:5\' 5"  (165.1 cm)   WT:135 lb (61.2 kg)  BMI:22.47 PHYSICAL EXAM: Gen: NAD, alert, cooperative with exam, well-appearing HEENT: clear conjunctiva,  CV:  no edema, capillary refill brisk, normal rate Resp: non-labored Skin: no rashes, normal turgor  Neuro: no gross deficits.  Psych:  alert and oriented  Right foot: Foot: Inspection: unable to visualize tendons on right foot as well as left Palpation: no TTP. 2nd MT with palpable capsule  ROM: Full ROM of the ankle. Able to flex and extend 2nd toe. Normal midfoot flexibility Strength: 5/5 strength ankle in all planes Neurovascular: N/V intact distally in the lower extremity  Left foot: No deformity noted No TTP Full ROM Full strength in all planes NVI  [place]: right foot Distal 2nd MT area of large calcification and fluid cap noted. Under  new area of maximal tenderness, she has some neovascularization but no cortical irregularity or hypoechoic changes   Impression: healing 2nd MT fracture with large callous formation, inflammation in cortex of 3rd MT as well consistent with stress reaction  ASSESSMENT & PLAN:   39 yo presenting for 6 week follow up after right foot injury, with significant callous formation indicating 2nd MT fracture. 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 Discussed allowing pain to be her guide but being careful about doing too much and setting herself back in the healing process. Pleased that she continues to have good callous formation. Will re-evaluate in 4 weeks.  I was the preceptor for this visit and available for immediate consultation. Marsa Aris, DO

## 2019-04-05 DIAGNOSIS — F900 Attention-deficit hyperactivity disorder, predominantly inattentive type: Secondary | ICD-10-CM | POA: Diagnosis not present

## 2019-04-05 DIAGNOSIS — F3341 Major depressive disorder, recurrent, in partial remission: Secondary | ICD-10-CM | POA: Diagnosis not present

## 2019-04-15 ENCOUNTER — Ambulatory Visit: Payer: BC Managed Care – PPO | Admitting: Pediatrics

## 2019-04-16 ENCOUNTER — Ambulatory Visit: Payer: BC Managed Care – PPO | Admitting: Pediatrics

## 2019-04-22 ENCOUNTER — Ambulatory Visit: Payer: BC Managed Care – PPO | Admitting: Pediatrics

## 2019-04-29 ENCOUNTER — Ambulatory Visit (INDEPENDENT_AMBULATORY_CARE_PROVIDER_SITE_OTHER): Payer: BC Managed Care – PPO | Admitting: Pediatrics

## 2019-04-29 ENCOUNTER — Other Ambulatory Visit: Payer: Self-pay

## 2019-04-29 VITALS — BP 120/68 | Ht 65.0 in | Wt 140.0 lb

## 2019-04-29 DIAGNOSIS — S99921D Unspecified injury of right foot, subsequent encounter: Secondary | ICD-10-CM

## 2019-04-29 NOTE — Progress Notes (Signed)
  Kathy Vaughan - 39 y.o. 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  Since she was seen last on 1/14, she has had total improvement in pain. She has been gradually getting back into exercise. She has not been trail running yet but has been walking often. She is looking forward to adding running back into her workout regimen. She feels like her running form has changed due to foot injury and is wondering if she can see someone who can review her form. She has had intermittent right SI joint pain that has flared up recently.  ROS: No instability, muscle pain, numbness/tingling, redness, otherwise see HPI   PMHx - Updated and reviewed.  Contributory factors include: Negative PSHx - Updated and reviewed.  Contributory factors include:  Negative FHx - Updated and reviewed.  Contributory factors include:  Negative Social Hx - Updated and reviewed. Contributory factors include: Negative Medications - reviewed   DATA REVIEWED: none  PHYSICAL EXAM:  VS: BP:120/68  HR: bpm  TEMP: ( )  RESP:   HT:5\' 5"  (165.1 cm)   WT:140 lb (63.5 kg)  BMI:23.3 PHYSICAL EXAM: Gen: NAD, alert, cooperative with exam, well-appearing HEENT: clear conjunctiva,  CV:  no edema, capillary refill brisk, normal rate Resp: non-labored Skin: no rashes, normal turgor  Neuro: no gross deficits.  Psych:  alert and oriented  Right foot: Foot: Inspection: normal appearing foot, no bruising Palpation: no TTP over 2nd MT  ROM: Full ROM of the ankle. Able to flex and extend 2nd toe. Normal midfoot flexibility Strength: 5/5 strength ankle in all planes Neurovascular: N/V intact distally in the lower extremity  Left foot: No deformity noted No TTP Full ROM Full strength in all planes NVI  Normal gait   ASSESSMENT & PLAN:   39 yo presenting for follow up of right 2nd MT  fracture with significant improvement in pain. She has now started getting back to exercise without pain. Discussed the importance of gradual increase in exercise amount 10% a week. She voiced agreement. She would like a referral to PT to help with her running form and to help with intermittent right SI joint pain that she has been having- she feels like her injured foot has thrown off her form recently and caused SI joint pain to flaire. Will refer to Rockwell Alexandria with [facility] at [facility]. Return as needed.    I was the preceptor for this visit and available for immediate consultation Marsa Aris, DO

## 2019-05-04 DIAGNOSIS — M6281 Muscle weakness (generalized): Secondary | ICD-10-CM | POA: Diagnosis not present

## 2019-05-04 DIAGNOSIS — M25651 Stiffness of right hip, not elsewhere classified: Secondary | ICD-10-CM | POA: Diagnosis not present

## 2019-05-18 DIAGNOSIS — M6281 Muscle weakness (generalized): Secondary | ICD-10-CM | POA: Diagnosis not present

## 2019-05-18 DIAGNOSIS — M25651 Stiffness of right hip, not elsewhere classified: Secondary | ICD-10-CM | POA: Diagnosis not present

## 2019-06-11 DIAGNOSIS — Z833 Family history of diabetes mellitus: Secondary | ICD-10-CM | POA: Diagnosis not present

## 2019-06-11 DIAGNOSIS — B354 Tinea corporis: Secondary | ICD-10-CM | POA: Diagnosis not present

## 2019-09-30 DIAGNOSIS — F900 Attention-deficit hyperactivity disorder, predominantly inattentive type: Secondary | ICD-10-CM | POA: Diagnosis not present

## 2019-09-30 DIAGNOSIS — F3341 Major depressive disorder, recurrent, in partial remission: Secondary | ICD-10-CM | POA: Diagnosis not present

## 2019-12-06 DIAGNOSIS — M21962 Unspecified acquired deformity of left lower leg: Secondary | ICD-10-CM | POA: Diagnosis not present

## 2019-12-06 DIAGNOSIS — M21611 Bunion of right foot: Secondary | ICD-10-CM | POA: Diagnosis not present

## 2019-12-06 DIAGNOSIS — G5762 Lesion of plantar nerve, left lower limb: Secondary | ICD-10-CM | POA: Diagnosis not present

## 2019-12-06 DIAGNOSIS — M21961 Unspecified acquired deformity of right lower leg: Secondary | ICD-10-CM | POA: Diagnosis not present

## 2019-12-06 DIAGNOSIS — M7752 Other enthesopathy of left foot: Secondary | ICD-10-CM | POA: Diagnosis not present

## 2019-12-06 DIAGNOSIS — M21612 Bunion of left foot: Secondary | ICD-10-CM | POA: Diagnosis not present

## 2019-12-27 DIAGNOSIS — G5762 Lesion of plantar nerve, left lower limb: Secondary | ICD-10-CM | POA: Diagnosis not present

## 2019-12-27 DIAGNOSIS — M7752 Other enthesopathy of left foot: Secondary | ICD-10-CM | POA: Diagnosis not present

## 2019-12-27 DIAGNOSIS — M21611 Bunion of right foot: Secondary | ICD-10-CM | POA: Diagnosis not present

## 2019-12-27 DIAGNOSIS — M21612 Bunion of left foot: Secondary | ICD-10-CM | POA: Diagnosis not present

## 2020-01-05 DIAGNOSIS — M4316 Spondylolisthesis, lumbar region: Secondary | ICD-10-CM | POA: Diagnosis not present

## 2020-01-05 DIAGNOSIS — M545 Low back pain, unspecified: Secondary | ICD-10-CM | POA: Diagnosis not present

## 2020-01-05 DIAGNOSIS — M4300 Spondylolysis, site unspecified: Secondary | ICD-10-CM | POA: Diagnosis not present

## 2020-01-05 DIAGNOSIS — M25552 Pain in left hip: Secondary | ICD-10-CM | POA: Diagnosis not present

## 2020-01-10 DIAGNOSIS — M25511 Pain in right shoulder: Secondary | ICD-10-CM | POA: Diagnosis not present

## 2020-01-10 DIAGNOSIS — M25572 Pain in left ankle and joints of left foot: Secondary | ICD-10-CM | POA: Diagnosis not present

## 2020-01-14 DIAGNOSIS — M21961 Unspecified acquired deformity of right lower leg: Secondary | ICD-10-CM | POA: Diagnosis not present

## 2020-01-14 DIAGNOSIS — M21962 Unspecified acquired deformity of left lower leg: Secondary | ICD-10-CM | POA: Diagnosis not present

## 2020-01-14 DIAGNOSIS — M21611 Bunion of right foot: Secondary | ICD-10-CM | POA: Diagnosis not present

## 2020-01-14 DIAGNOSIS — M21612 Bunion of left foot: Secondary | ICD-10-CM | POA: Diagnosis not present

## 2020-01-16 DIAGNOSIS — Z20822 Contact with and (suspected) exposure to covid-19: Secondary | ICD-10-CM | POA: Diagnosis not present

## 2020-02-16 DIAGNOSIS — Z20822 Contact with and (suspected) exposure to covid-19: Secondary | ICD-10-CM | POA: Diagnosis not present

## 2020-02-16 DIAGNOSIS — B354 Tinea corporis: Secondary | ICD-10-CM | POA: Diagnosis not present

## 2020-03-15 DIAGNOSIS — B354 Tinea corporis: Secondary | ICD-10-CM | POA: Diagnosis not present

## 2020-03-28 DIAGNOSIS — F900 Attention-deficit hyperactivity disorder, predominantly inattentive type: Secondary | ICD-10-CM | POA: Diagnosis not present

## 2020-03-28 DIAGNOSIS — F3341 Major depressive disorder, recurrent, in partial remission: Secondary | ICD-10-CM | POA: Diagnosis not present

## 2020-04-03 DIAGNOSIS — M7742 Metatarsalgia, left foot: Secondary | ICD-10-CM | POA: Diagnosis not present

## 2020-04-03 DIAGNOSIS — M2042 Other hammer toe(s) (acquired), left foot: Secondary | ICD-10-CM | POA: Diagnosis not present

## 2020-05-02 DIAGNOSIS — J069 Acute upper respiratory infection, unspecified: Secondary | ICD-10-CM | POA: Diagnosis not present

## 2020-05-02 DIAGNOSIS — J029 Acute pharyngitis, unspecified: Secondary | ICD-10-CM | POA: Diagnosis not present

## 2020-05-02 DIAGNOSIS — R059 Cough, unspecified: Secondary | ICD-10-CM | POA: Diagnosis not present

## 2020-05-03 DIAGNOSIS — M2042 Other hammer toe(s) (acquired), left foot: Secondary | ICD-10-CM | POA: Diagnosis not present

## 2020-05-03 DIAGNOSIS — M21612 Bunion of left foot: Secondary | ICD-10-CM | POA: Diagnosis not present

## 2020-05-03 DIAGNOSIS — M7742 Metatarsalgia, left foot: Secondary | ICD-10-CM | POA: Diagnosis not present

## 2020-05-30 DIAGNOSIS — Z6822 Body mass index (BMI) 22.0-22.9, adult: Secondary | ICD-10-CM | POA: Diagnosis not present

## 2020-05-30 DIAGNOSIS — Z01419 Encounter for gynecological examination (general) (routine) without abnormal findings: Secondary | ICD-10-CM | POA: Diagnosis not present

## 2020-06-06 DIAGNOSIS — Z1231 Encounter for screening mammogram for malignant neoplasm of breast: Secondary | ICD-10-CM | POA: Diagnosis not present

## 2020-06-14 DIAGNOSIS — M2042 Other hammer toe(s) (acquired), left foot: Secondary | ICD-10-CM | POA: Diagnosis not present

## 2020-06-14 DIAGNOSIS — M7742 Metatarsalgia, left foot: Secondary | ICD-10-CM | POA: Diagnosis not present

## 2020-08-14 DIAGNOSIS — L259 Unspecified contact dermatitis, unspecified cause: Secondary | ICD-10-CM | POA: Diagnosis not present

## 2020-08-14 DIAGNOSIS — M7742 Metatarsalgia, left foot: Secondary | ICD-10-CM | POA: Diagnosis not present

## 2020-09-05 DIAGNOSIS — M79672 Pain in left foot: Secondary | ICD-10-CM | POA: Diagnosis not present

## 2020-09-13 DIAGNOSIS — M7742 Metatarsalgia, left foot: Secondary | ICD-10-CM | POA: Diagnosis not present

## 2020-09-13 DIAGNOSIS — M2022 Hallux rigidus, left foot: Secondary | ICD-10-CM | POA: Diagnosis not present

## 2020-09-21 DIAGNOSIS — F332 Major depressive disorder, recurrent severe without psychotic features: Secondary | ICD-10-CM | POA: Diagnosis not present

## 2020-09-21 DIAGNOSIS — F900 Attention-deficit hyperactivity disorder, predominantly inattentive type: Secondary | ICD-10-CM | POA: Diagnosis not present

## 2020-10-06 ENCOUNTER — Other Ambulatory Visit: Payer: Self-pay

## 2020-10-06 ENCOUNTER — Ambulatory Visit (INDEPENDENT_AMBULATORY_CARE_PROVIDER_SITE_OTHER): Payer: BC Managed Care – PPO | Admitting: Sports Medicine

## 2020-10-06 ENCOUNTER — Ambulatory Visit
Admission: RE | Admit: 2020-10-06 | Discharge: 2020-10-06 | Disposition: A | Discharge status: Home / Self Care | Payer: BC Managed Care – PPO | Source: Ambulatory Visit | Attending: Sports Medicine | Admitting: Sports Medicine

## 2020-10-06 VITALS — BP 122/78 | Ht 64.75 in | Wt 138.0 lb

## 2020-10-06 DIAGNOSIS — M25559 Pain in unspecified hip: Secondary | ICD-10-CM | POA: Diagnosis not present

## 2020-10-06 DIAGNOSIS — M25859 Other specified joint disorders, unspecified hip: Secondary | ICD-10-CM

## 2020-10-06 DIAGNOSIS — M1612 Unilateral primary osteoarthritis, left hip: Secondary | ICD-10-CM | POA: Diagnosis not present

## 2020-10-06 DIAGNOSIS — M25551 Pain in right hip: Secondary | ICD-10-CM | POA: Diagnosis not present

## 2020-10-06 DIAGNOSIS — M533 Sacrococcygeal disorders, not elsewhere classified: Secondary | ICD-10-CM | POA: Diagnosis not present

## 2020-10-06 NOTE — Progress Notes (Signed)
   Subjective:    Patient ID: Kathy Vaughan, female    DOB: [birth year], 40 y.o.   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  Her symptoms originally began about 2 years ago without any known trauma.  She has been seen at [facility] in the past and diagnosed with femoral acetabular impingement after an x-ray at that office showed a retroverted acetabulum and a small cam deformity.  She has also been diagnosed with an L4-L5 spondylolisthesis.  Treatment has consisted of physical therapy and over-the-counter medications.  Her symptoms became acutely worse about a month ago.  Her pain is most noticeable with any sort of lateral movement but she also endorses pain with internal and external rotation of the hip.  Her pain is deep within the hip where she also experiences popping.  No radiating pain down the leg.  She does endorse some right-sided low back pain but nothing on the left.  No prior hip surgeries.  Past medical history reviewed Medications reviewed Allergies reviewed    Review of Systems As above    Objective:   Physical Exam  Well-developed, fit appearing.  No acute distress  Left hip: There is limited internal rotation by about 20 to 30%.  Full external rotation passively.  There is no pain with resisted adduction or abduction.  Excellent strength.  Negative Trendelenburg.  Positive FADIR.  Negative FABER.  Neurovascularly intact distally.      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  Those x-rays were done in November 2021.  I am going to get some updated x-rays today and we will plan on ordering an MRI arthrogram of her left hip to evaluate labral pathology secondary to this impingement.  Some of her symptoms may be  from internal snapping hip but the chronicity strongly suggests impingement.  Phone follow-up with those results once available.  We may consider a diagnostic/therapeutic intra-articular injection.  Patient agrees with this plan.

## 2020-10-11 DIAGNOSIS — R21 Rash and other nonspecific skin eruption: Secondary | ICD-10-CM | POA: Diagnosis not present

## 2020-10-17 DIAGNOSIS — F332 Major depressive disorder, recurrent severe without psychotic features: Secondary | ICD-10-CM | POA: Diagnosis not present

## 2020-10-17 DIAGNOSIS — F900 Attention-deficit hyperactivity disorder, predominantly inattentive type: Secondary | ICD-10-CM | POA: Diagnosis not present

## 2020-10-20 DIAGNOSIS — F330 Major depressive disorder, recurrent, mild: Secondary | ICD-10-CM | POA: Diagnosis not present

## 2020-10-27 ENCOUNTER — Ambulatory Visit
Admission: RE | Admit: 2020-10-27 | Discharge: 2020-10-27 | Disposition: A | Discharge status: Home / Self Care | Payer: BC Managed Care – PPO | Source: Ambulatory Visit | Attending: Sports Medicine | Admitting: Sports Medicine

## 2020-10-27 ENCOUNTER — Other Ambulatory Visit: Payer: Self-pay

## 2020-10-27 DIAGNOSIS — M25559 Pain in unspecified hip: Secondary | ICD-10-CM

## 2020-10-27 DIAGNOSIS — S43432A Superior glenoid labrum lesion of left shoulder, initial encounter: Secondary | ICD-10-CM | POA: Diagnosis not present

## 2020-10-27 DIAGNOSIS — M25552 Pain in left hip: Secondary | ICD-10-CM | POA: Diagnosis not present

## 2020-10-27 MED ORDER — IOPAMIDOL (ISOVUE-M 200) INJECTION 41%
13.0000 mL | Freq: Once | INTRAMUSCULAR | Status: AC
  Administered 2020-10-27: 13 mL via INTRA_ARTICULAR

## 2020-11-02 ENCOUNTER — Other Ambulatory Visit: Payer: Self-pay

## 2020-11-02 ENCOUNTER — Ambulatory Visit (INDEPENDENT_AMBULATORY_CARE_PROVIDER_SITE_OTHER): Payer: BC Managed Care – PPO | Admitting: Sports Medicine

## 2020-11-02 VITALS — Ht 64.75 in | Wt 137.0 lb

## 2020-11-02 DIAGNOSIS — S73192D Other sprain of left hip, subsequent encounter: Secondary | ICD-10-CM

## 2020-11-02 DIAGNOSIS — M25559 Pain in unspecified hip: Secondary | ICD-10-CM

## 2020-11-03 ENCOUNTER — Encounter: Payer: Self-pay | Admitting: Sports Medicine

## 2020-11-03 NOTE — Progress Notes (Signed)
Patient ID: Kathy Vaughan, female   DOB: [birth year], 40 y.o.   MRN: [id number]  Kelise presents today to discuss MRI arthrogram findings of her left hip.  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  Based on these findings I recommend consultation with Dr. Magnus Ivan to discuss treatment options.  I will defer further work-up and treatment to the discretion of Dr. Magnus Ivan and the patient will follow up with me as needed.

## 2020-12-04 DIAGNOSIS — B360 Pityriasis versicolor: Secondary | ICD-10-CM | POA: Diagnosis not present

## 2020-12-05 DIAGNOSIS — F330 Major depressive disorder, recurrent, mild: Secondary | ICD-10-CM | POA: Diagnosis not present

## 2020-12-07 ENCOUNTER — Encounter: Payer: Self-pay | Admitting: Orthopaedic Surgery

## 2020-12-07 ENCOUNTER — Ambulatory Visit (INDEPENDENT_AMBULATORY_CARE_PROVIDER_SITE_OTHER): Payer: BC Managed Care – PPO | Admitting: Orthopaedic Surgery

## 2020-12-07 DIAGNOSIS — S73192A Other sprain of left hip, initial encounter: Secondary | ICD-10-CM | POA: Diagnosis not present

## 2020-12-07 DIAGNOSIS — M25552 Pain in left hip: Secondary | ICD-10-CM

## 2020-12-07 NOTE — Progress Notes (Signed)
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  She is incredibly athletic and is very physically fit.  She has been dealing with left hip and groin pain for about 2 years now.  It got worse about 2 months after doing a lateral type of movement during exercises and working out and that has made it worse.  She has not had any type of intra-articular injection of that hip.  She does have plain films and a MRI arthrogram of the left hip.  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  There is no hip joint effusion and the cartilage is intact on the femoral head and the acetabulum.  I did review the MRI with her and I went over her plain films as well.  She may have a small cam type of affect of the left hip which has led to this.  I did examine both hips today.  Her left hip has full and fluid range of motion but definitely has pain in the groin with extremes of internal and external rotation of that left hip.  And again, I went over the x-ray findings and plain film findings.  She understands that stopping high impact aerobic activities will be helpful for her left hip.  I would like to send her to Dr. Renella Cunas a hip arthroscopy specialist with [facility] for an evaluation of her left hip.  She agrees with this referral.

## 2020-12-08 ENCOUNTER — Other Ambulatory Visit: Payer: Self-pay

## 2020-12-08 DIAGNOSIS — M25552 Pain in left hip: Secondary | ICD-10-CM

## 2020-12-13 DIAGNOSIS — M21612 Bunion of left foot: Secondary | ICD-10-CM | POA: Diagnosis not present

## 2020-12-13 DIAGNOSIS — M21611 Bunion of right foot: Secondary | ICD-10-CM | POA: Diagnosis not present

## 2020-12-13 DIAGNOSIS — M7742 Metatarsalgia, left foot: Secondary | ICD-10-CM | POA: Diagnosis not present

## 2020-12-13 DIAGNOSIS — M7741 Metatarsalgia, right foot: Secondary | ICD-10-CM | POA: Diagnosis not present

## 2020-12-15 ENCOUNTER — Other Ambulatory Visit (HOSPITAL_COMMUNITY): Payer: Self-pay | Admitting: Orthopedic Surgery

## 2020-12-18 DIAGNOSIS — F330 Major depressive disorder, recurrent, mild: Secondary | ICD-10-CM | POA: Diagnosis not present

## 2020-12-25 DIAGNOSIS — F330 Major depressive disorder, recurrent, mild: Secondary | ICD-10-CM | POA: Diagnosis not present

## 2021-01-02 DIAGNOSIS — L989 Disorder of the skin and subcutaneous tissue, unspecified: Secondary | ICD-10-CM | POA: Diagnosis not present

## 2021-01-02 DIAGNOSIS — D485 Neoplasm of uncertain behavior of skin: Secondary | ICD-10-CM | POA: Diagnosis not present

## 2021-01-03 ENCOUNTER — Emergency Department (HOSPITAL_COMMUNITY): Payer: BC Managed Care – PPO

## 2021-01-03 ENCOUNTER — Emergency Department (HOSPITAL_COMMUNITY)
Admission: EM | Admit: 2021-01-03 | Discharge: 2021-01-04 | Disposition: A | Discharge status: Home / Self Care | Payer: BC Managed Care – PPO | Attending: Emergency Medicine | Admitting: Emergency Medicine

## 2021-01-03 ENCOUNTER — Encounter (HOSPITAL_COMMUNITY): Payer: Self-pay

## 2021-01-03 ENCOUNTER — Other Ambulatory Visit: Payer: Self-pay

## 2021-01-03 DIAGNOSIS — N200 Calculus of kidney: Secondary | ICD-10-CM | POA: Diagnosis not present

## 2021-01-03 DIAGNOSIS — R109 Unspecified abdominal pain: Secondary | ICD-10-CM | POA: Diagnosis not present

## 2021-01-03 DIAGNOSIS — N201 Calculus of ureter: Secondary | ICD-10-CM

## 2021-01-03 DIAGNOSIS — N9489 Other specified conditions associated with female genital organs and menstrual cycle: Secondary | ICD-10-CM | POA: Insufficient documentation

## 2021-01-03 LAB — CBC WITH DIFFERENTIAL/PLATELET
Abs Immature Granulocytes: 0.05 10*3/uL (ref 0.00–0.07)
Basophils Absolute: 0 10*3/uL (ref 0.0–0.1)
Basophils Relative: 0 %
Eosinophils Absolute: 0.1 10*3/uL (ref 0.0–0.5)
Eosinophils Relative: 1 %
HCT: 35.3 % — ABNORMAL LOW (ref 36.0–46.0)
Hemoglobin: 11.8 g/dL — ABNORMAL LOW (ref 12.0–15.0)
Immature Granulocytes: 0 %
Lymphocytes Relative: 6 %
Lymphs Abs: 0.7 10*3/uL (ref 0.7–4.0)
MCH: 30.3 pg (ref 26.0–34.0)
MCHC: 33.4 g/dL (ref 30.0–36.0)
MCV: 90.7 fL (ref 80.0–100.0)
Monocytes Absolute: 0.9 10*3/uL (ref 0.1–1.0)
Monocytes Relative: 8 %
Neutro Abs: 9.5 10*3/uL — ABNORMAL HIGH (ref 1.7–7.7)
Neutrophils Relative %: 85 %
Platelets: 218 10*3/uL (ref 150–400)
RBC: 3.89 MIL/uL (ref 3.87–5.11)
RDW: 11.9 % (ref 11.5–15.5)
WBC: 11.2 10*3/uL — ABNORMAL HIGH (ref 4.0–10.5)
nRBC: 0 % (ref 0.0–0.2)

## 2021-01-03 LAB — I-STAT BETA HCG BLOOD, ED (MC, WL, AP ONLY): I-stat hCG, quantitative: <5 m[IU]/mL (ref <5)

## 2021-01-03 LAB — COMPREHENSIVE METABOLIC PANEL
ALT: 16 U/L (ref 0–44)
AST: 22 U/L (ref 15–41)
Albumin: 3.6 g/dL (ref 3.5–5.0)
Alkaline Phosphatase: 58 U/L (ref 38–126)
Anion gap: 6 (ref 5–15)
BUN: 14 mg/dL (ref 6–20)
CO2: 25 mmol/L (ref 22–32)
Calcium: 8.9 mg/dL (ref 8.9–10.3)
Chloride: 102 mmol/L (ref 98–111)
Creatinine, Ser: 1.14 mg/dL — ABNORMAL HIGH (ref 0.44–1.00)
GFR, Estimated: >60 mL/min (ref >60)
Glucose, Bld: 133 mg/dL — ABNORMAL HIGH (ref 70–99)
Potassium: 4 mmol/L (ref 3.5–5.1)
Sodium: 133 mmol/L — ABNORMAL LOW (ref 135–145)
Total Bilirubin: 1 mg/dL (ref 0.3–1.2)
Total Protein: 6.5 g/dL (ref 6.5–8.1)

## 2021-01-03 LAB — LIPASE, BLOOD: Lipase: 29 U/L (ref 11–51)

## 2021-01-03 MED ORDER — IOHEXOL 350 MG/ML SOLN
80.0000 mL | Freq: Once | INTRAVENOUS | Status: AC | PRN
  Administered 2021-01-03: 80 mL via INTRAVENOUS

## 2021-01-03 MED ORDER — FENTANYL CITRATE PF 50 MCG/ML IJ SOSY
50.0000 ug | PREFILLED_SYRINGE | Freq: Once | INTRAMUSCULAR | Status: AC
  Administered 2021-01-03: 50 ug via INTRAVENOUS
  Filled 2021-01-03: qty 1

## 2021-01-03 MED ORDER — ONDANSETRON HCL 4 MG/2ML IJ SOLN
4.0000 mg | Freq: Once | INTRAMUSCULAR | Status: AC
  Administered 2021-01-03: 4 mg via INTRAVENOUS
  Filled 2021-01-03: qty 2

## 2021-01-03 NOTE — ED Provider Notes (Signed)
[facility] DEPT Provider Note   CSN: [id number] Arrival date & time: 01/03/21  2004     History Chief Complaint  Patient presents with   Abdominal Pain    Kathy Vaughan is a 40 y.o. female.  The history is provided by the patient and medical records.  Abdominal Pain  40 y.o. F with hx of ADHD, depression, presenting to the ED for abdominal pain.  States she woke up this morning with abdominal pain, worsening throughout the day.  States no nausea or vomiting but has had decreased appetite and has not wanted to eat.  She denies fever/chills/sweats.  No pain yesterday at all, felt totally normal.  No prior abdominal surgeries.  No meds PTA.  Past Medical History:  Diagnosis Date   ADHD    Depressed     Patient Active Problem List   Diagnosis Date Noted   Sacro-iliac pain 09/06/2015   TOBACCO USER 11/05/2008   KNEE PAIN, RIGHT 08/04/2007   FX, STRESS, TIBIA OR FIBULA 11/07/2006   SYMPTOM, CHANGES IN SKIN TEXTURE 07/18/2006   PAIN IN JOINT, ANKLE/FOOT 07/04/2006   GANGLION, TENDON SHEATH 07/04/2006   LEG PAIN 07/04/2006   PES PLANUS 07/04/2006   SPASM, MUSCLE 06/02/2006    History reviewed. No pertinent surgical history.   OB History   No obstetric history on file.     No family history on file.  Social History   Tobacco Use   Smoking status: Never   Smokeless tobacco: Never  Substance Use Topics   Alcohol use: No    Alcohol/week: 0.0 standard drinks    Home Medications Prior to Admission medications   Medication Sig Start Date End Date Taking? Authorizing Provider  ondansetron (ZOFRAN ODT) 4 MG disintegrating tablet Take 1 tablet (4 mg total) by mouth every 8 (eight) hours as needed for nausea. 01/04/21  Yes Larene Pickett, PA-C  oxyCODONE-acetaminophen (PERCOCET) 5-325 MG tablet Take 1 tablet by mouth every 4 (four) hours as needed. 01/04/21  Yes Larene Pickett, PA-C  tamsulosin (FLOMAX) 0.4 MG CAPS capsule Take 1 capsule  (0.4 mg total) by mouth daily after supper. 01/04/21  Yes Larene Pickett, PA-C  DULoxetine (CYMBALTA) 30 MG capsule Take 30 mg by mouth daily. 01/28/19   [provider]  DULoxetine (CYMBALTA) 60 MG capsule Take 60 mg by mouth daily. 07/24/15   [provider]  VYVANSE 60 MG capsule Take 60 mg by mouth daily as needed. 12/31/18   [provider]  WELLBUTRIN XL 150 MG 24 hr tablet Take 150 mg by mouth daily. 01/05/19   [provider]    Allergies    Patient has no known allergies.  Review of Systems   Review of Systems  Constitutional:  Positive for appetite change.  Gastrointestinal:  Positive for abdominal pain.  All other systems reviewed and are negative.  Physical Exam Updated Vital Signs BP 103/63   Pulse 81   Temp 98.3 F (36.8 C) (Oral)   Resp 16   Ht 5\' 5"  (1.651 m)   Wt 61.2 kg   SpO2 97%   BMI 22.47 kg/m   Physical Exam Vitals and nursing note reviewed.  Constitutional:      Appearance: She is well-developed.  HENT:     Head: Normocephalic and atraumatic.  Eyes:     Conjunctiva/sclera: Conjunctivae normal.     Pupils: Pupils are equal, round, and reactive to light.  Cardiovascular:     Rate and Rhythm:  Normal rate and regular rhythm.     Heart sounds: Normal heart sounds.  Pulmonary:     Effort: Pulmonary effort is normal.     Breath sounds: Normal breath sounds.  Abdominal:     General: Bowel sounds are normal.     Palpations: Abdomen is soft.     Tenderness: There is abdominal tenderness.     Comments: Tender right upper and lower quadrants, pain improved with lying in fetal position during exam  Musculoskeletal:        General: Normal range of motion.     Cervical back: Normal range of motion.  Skin:    General: Skin is warm and dry.  Neurological:     Mental Status: She is alert and oriented to person, place, and time.    ED Results / Procedures / Treatments   Labs (all labs ordered are listed, but only  abnormal results are displayed) Labs Reviewed  COMPREHENSIVE METABOLIC PANEL - Abnormal; Notable for the following components:      Result Value   Sodium 133 (*)    Glucose, Bld 133 (*)    Creatinine, Ser 1.14 (*)    All other components within normal limits  CBC WITH DIFFERENTIAL/PLATELET - Abnormal; Notable for the following components:   WBC 11.2 (*)    Hemoglobin 11.8 (*)    HCT 35.3 (*)    Neutro Abs 9.5 (*)    All other components within normal limits  LIPASE, BLOOD  URINALYSIS, ROUTINE W REFLEX MICROSCOPIC  I-STAT BETA HCG BLOOD, ED ([facility], [facility], [facility] ONLY)    EKG None  Radiology CT ABDOMEN PELVIS W CONTRAST  Result Date: 01/03/2021 CLINICAL DATA:  Right lower quadrant abdominal pain since this morning. EXAM: CT ABDOMEN AND PELVIS WITH CONTRAST TECHNIQUE: Multidetector CT imaging of the abdomen and pelvis was performed using the standard protocol following bolus administration of intravenous contrast. CONTRAST:  72mL OMNIPAQUE IOHEXOL 350 MG/ML SOLN COMPARISON:  None. FINDINGS: Lower chest: No acute abnormality. Hepatobiliary: No suspicious hepatic lesion. Gallbladder is unremarkable. No biliary ductal dilation. Pancreas: No pancreatic ductal dilation or evidence of acute inflammation. Spleen: Within normal limits. Adrenals/Urinary Tract: Bilateral adrenal glands are unremarkable. Left kidney is unremarkable without hydronephrosis or nephrolithiasis. Right hydronephrosis to the level of a 4 mm stone at the ureteropelvic junction with prominent perinephric stranding/fluid and delayed renal enhancement. Multiple other nonobstructive right renal stones measuring up to 3 mm. Thin wall fluid density 1.4 cm right interpolar renal lesion is favored to reflect a cyst. Stomach/Bowel: Small hiatal hernia otherwise the stomach is decompressed. No pathologic dilation of small or large bowel. The appendix and terminal ileum appear normal. There is some fluid posterior to the ascending colon at the  interface with the right kidney which is favored related to the right renal obstruction. Vascular/Lymphatic: No abdominal aortic aneurysm. No pathologically enlarged abdominal or pelvic lymph nodes. Reproductive: Intrauterine device appears appropriate in positioning. No suspicious adnexal mass. Other: No pneumoperitoneum.  No walled off fluid collections. Musculoskeletal: Chronic appearing L4 pars defects with grade 1 L4 on L5 anterolisthesis and L4-L5 discogenic disease. IMPRESSION: 1. Right-sided hydronephrosis to the level of a 4 mm stone at the right ureteropelvic junction with prominent perinephric stranding/fluid and delayed renal enhancement. Recommend correlation with laboratory values to exclude superimposed infection. 2. Multiple other nonobstructive right renal stones measuring up to 3 mm. 3. Moderate volume of formed stool throughout the colon, reflecting constipation. 4. Chronic appearing L4 pars defects with grade 1 L4 on  L5 anterolisthesis and L4-L5 discogenic disease. Electronically Signed   By: Maudry Mayhew M.D.   On: 01/03/2021 23:18    Procedures Procedures   Medications Ordered in ED Medications  iohexol (OMNIPAQUE) 350 MG/ML injection 80 mL (80 mLs Intravenous Contrast Given 01/03/21 2256)  ondansetron (ZOFRAN) injection 4 mg (4 mg Intravenous Given 01/03/21 2321)  fentaNYL (SUBLIMAZE) injection 50 mcg (50 mcg Intravenous Given 01/03/21 2322)  oxyCODONE-acetaminophen (PERCOCET/ROXICET) 5-325 MG per tablet 2 tablet (2 tablets Oral Given 01/04/21 0251)    ED Course  I have reviewed the triage vital signs and the nursing notes.  Pertinent labs & imaging results that were available during my care of the patient were reviewed by me and considered in my medical decision making (see chart for details).    MDM Rules/Calculators/A&P                           40 year old female presenting to the ED with right-sided abdominal pain that began earlier this morning.  States she has had  decreased appetite but no nausea or vomiting.  She is afebrile and nontoxic in appearance here.  Pain does improve with lying in the fetal position, worse with moving around.  Labs from triage are overall reassuring.  UA is pending.  CT has been ordered.  CT with 4 mm right ureteral stone.  There is some hydronephrosis.  UA without any signs of infection.  Pain controlled here.  Remains afebrile and nontoxic.  She is able to tolerate oral medications.  Feel she is stable for discharge home with expectant management.  She is given urology follow-up.  Encouraged to return here for any new or acute changes including fever, inability to tolerate oral medications, inability to urinate, etc.  Final Clinical Impression(s) / ED Diagnoses Final diagnoses:  Right ureteral stone    Rx / DC Orders ED Discharge Orders          Ordered    oxyCODONE-acetaminophen (PERCOCET) 5-325 MG tablet  Every 4 hours PRN        01/04/21 0321    ondansetron (ZOFRAN ODT) 4 MG disintegrating tablet  Every 8 hours PRN        01/04/21 0321    tamsulosin (FLOMAX) 0.4 MG CAPS capsule  Daily after supper        01/04/21 0321             Garlon Hatchet, PA-C 01/04/21 1610    Cheryll Cockayne, MD 01/07/21 1313

## 2021-01-03 NOTE — ED Provider Notes (Signed)
Emergency Medicine Provider Triage Evaluation Note  Kaydon Creedon , a 40 y.o. female  was evaluated in triage.  Pt complains of acute onset abdominal pain that began this morning.  No history of abdominal surgeries.  Decreased appetite  Review of Systems  Positive: Abdominal pain, decreased appetite Negative: Diarrhea  Physical Exam  BP (!) 97/57 (BP Location: Left Arm)   Pulse 69   Temp 98.6 F (37 C) (Oral)   Resp 18   Ht 5\' 5"  (1.651 m)   Wt 61.2 kg   SpO2 100%   BMI 22.47 kg/m  Gen:   Awake, no distress   Resp:  Normal effort  MSK:   Moves extremities without difficulty  Other:  Tenderness to the abdomen.  Patient unable to get comfortable in the stretcher.  Medical Decision Making  Medically screening exam initiated at 9:04 PM.  Appropriate orders placed.  Larosa Pavek was informed that the remainder of the evaluation will be completed by another provider, this initial triage assessment does not replace that evaluation, and the importance of remaining in the ED until their evaluation is complete.     Marchelle Folks, PA-C 01/03/21 2105    2106, DO 01/05/21 0140

## 2021-01-03 NOTE — ED Triage Notes (Signed)
Pt reports RLQ abdominal pain beginning this morning. Nausea. Denies vomiting or diarrhea.

## 2021-01-04 LAB — URINALYSIS, ROUTINE W REFLEX MICROSCOPIC
Bilirubin Urine: NEGATIVE
Glucose, UA: NEGATIVE mg/dL
Hgb urine dipstick: NEGATIVE
Ketones, ur: NEGATIVE mg/dL
Leukocytes,Ua: NEGATIVE
Nitrite: NEGATIVE
Protein, ur: NEGATIVE mg/dL
Specific Gravity, Urine: 1.01 (ref 1.005–1.030)
pH: 5.5 (ref 5.0–8.0)

## 2021-01-04 MED ORDER — TAMSULOSIN HCL 0.4 MG PO CAPS: 0.4000 mg | ORAL_CAPSULE | Freq: Every day | ORAL | 0 refills | Status: DC

## 2021-01-04 MED ORDER — OXYCODONE-ACETAMINOPHEN 5-325 MG PO TABS: 1.0000 | ORAL_TABLET | ORAL | 0 refills | Status: DC | PRN

## 2021-01-04 MED ORDER — OXYCODONE-ACETAMINOPHEN 5-325 MG PO TABS
2.0000 | ORAL_TABLET | Freq: Once | ORAL | Status: AC
  Administered 2021-01-04: 2 via ORAL
  Filled 2021-01-04: qty 2

## 2021-01-04 MED ORDER — ONDANSETRON 4 MG PO TBDP: 4.0000 mg | ORAL_TABLET | Freq: Three times a day (TID) | ORAL | 0 refills | Status: DC | PRN

## 2021-01-04 NOTE — Discharge Instructions (Signed)
Take the prescribed medication as directed. Follow-up with urology.  Can call for appt. Return to the ED for new or worsening symptoms-- fever, unable to hold down meds, unable to urinate, etc.

## 2021-01-11 DIAGNOSIS — L309 Dermatitis, unspecified: Secondary | ICD-10-CM | POA: Diagnosis not present

## 2021-01-15 DIAGNOSIS — F330 Major depressive disorder, recurrent, mild: Secondary | ICD-10-CM | POA: Diagnosis not present

## 2021-01-16 DIAGNOSIS — M25552 Pain in left hip: Secondary | ICD-10-CM | POA: Diagnosis not present

## 2021-01-25 DIAGNOSIS — N202 Calculus of kidney with calculus of ureter: Secondary | ICD-10-CM | POA: Diagnosis not present

## 2021-01-29 DIAGNOSIS — F330 Major depressive disorder, recurrent, mild: Secondary | ICD-10-CM | POA: Diagnosis not present

## 2021-01-31 ENCOUNTER — Encounter (HOSPITAL_BASED_OUTPATIENT_CLINIC_OR_DEPARTMENT_OTHER): Payer: Self-pay | Admitting: Orthopedic Surgery

## 2021-01-31 ENCOUNTER — Other Ambulatory Visit: Payer: Self-pay

## 2021-02-05 DIAGNOSIS — F330 Major depressive disorder, recurrent, mild: Secondary | ICD-10-CM | POA: Diagnosis not present

## 2021-02-07 NOTE — Anesthesia Preprocedure Evaluation (Addendum)
Anesthesia Evaluation  Patient identified by MRN, date of birth, ID band Patient awake    Reviewed: Allergy & Precautions, H&P , NPO status , Patient's Chart, lab work & pertinent test results  Airway Mallampati: I  TM Distance: >3 FB Neck ROM: Full    Dental no notable dental hx. (+) Teeth Intact, Dental Advisory Given   Pulmonary neg pulmonary ROS,    Pulmonary exam normal breath sounds clear to auscultation       Cardiovascular negative cardio ROS   Rhythm:Regular Rate:Normal     Neuro/Psych Depression negative neurological ROS     GI/Hepatic negative GI ROS, Neg liver ROS,   Endo/Other  negative endocrine ROS  Renal/GU negative Renal ROS  negative genitourinary   Musculoskeletal   Abdominal   Peds  (+) ADHD Hematology negative hematology ROS (+)   Anesthesia Other Findings   Reproductive/Obstetrics negative OB ROS                            Anesthesia Physical Anesthesia Plan  ASA: 2  Anesthesia Plan: General   Post-op Pain Management: Regional block and Tylenol PO (pre-op)   Induction: Intravenous  PONV Risk Score and Plan: 3 and Ondansetron, Dexamethasone and Midazolam  Airway Management Planned: LMA  Additional Equipment:   Intra-op Plan:   Post-operative Plan: Extubation in OR  Informed Consent: I have reviewed the patients History and Physical, chart, labs and discussed the procedure including the risks, benefits and alternatives for the proposed anesthesia with the patient or authorized representative who has indicated his/her understanding and acceptance.     Dental advisory given  Plan Discussed with: CRNA and Surgeon  Anesthesia Plan Comments:        Anesthesia Quick Evaluation

## 2021-02-08 ENCOUNTER — Ambulatory Visit (HOSPITAL_BASED_OUTPATIENT_CLINIC_OR_DEPARTMENT_OTHER): Payer: BC Managed Care – PPO

## 2021-02-08 ENCOUNTER — Ambulatory Visit (HOSPITAL_BASED_OUTPATIENT_CLINIC_OR_DEPARTMENT_OTHER)
Admission: RE | Admit: 2021-02-08 | Discharge: 2021-02-08 | Disposition: A | Discharge status: Home / Self Care | Payer: BC Managed Care – PPO | Attending: Orthopedic Surgery | Admitting: Orthopedic Surgery

## 2021-02-08 ENCOUNTER — Ambulatory Visit (HOSPITAL_BASED_OUTPATIENT_CLINIC_OR_DEPARTMENT_OTHER): Payer: BC Managed Care – PPO | Admitting: Anesthesiology

## 2021-02-08 ENCOUNTER — Encounter (HOSPITAL_BASED_OUTPATIENT_CLINIC_OR_DEPARTMENT_OTHER)
Admission: RE | Disposition: A | Discharge status: Home / Self Care | Payer: Self-pay | Source: Home / Self Care | Attending: Orthopedic Surgery

## 2021-02-08 ENCOUNTER — Other Ambulatory Visit: Payer: Self-pay

## 2021-02-08 ENCOUNTER — Encounter (HOSPITAL_BASED_OUTPATIENT_CLINIC_OR_DEPARTMENT_OTHER): Payer: Self-pay | Admitting: Orthopedic Surgery

## 2021-02-08 DIAGNOSIS — M62838 Other muscle spasm: Secondary | ICD-10-CM | POA: Diagnosis not present

## 2021-02-08 DIAGNOSIS — M2042 Other hammer toe(s) (acquired), left foot: Secondary | ICD-10-CM | POA: Insufficient documentation

## 2021-02-08 DIAGNOSIS — F909 Attention-deficit hyperactivity disorder, unspecified type: Secondary | ICD-10-CM | POA: Diagnosis not present

## 2021-02-08 DIAGNOSIS — M21612 Bunion of left foot: Secondary | ICD-10-CM | POA: Diagnosis not present

## 2021-02-08 DIAGNOSIS — M7742 Metatarsalgia, left foot: Secondary | ICD-10-CM | POA: Insufficient documentation

## 2021-02-08 DIAGNOSIS — M24575 Contracture, left foot: Secondary | ICD-10-CM | POA: Diagnosis not present

## 2021-02-08 DIAGNOSIS — G8918 Other acute postprocedural pain: Secondary | ICD-10-CM | POA: Diagnosis not present

## 2021-02-08 HISTORY — PX: HAMMERTOE RECONSTRUCTION WITH WEIL OSTEOTOMY: SHX5631

## 2021-02-08 HISTORY — PX: BUNIONECTOMY: SHX129

## 2021-02-08 LAB — POCT PREGNANCY, URINE: Preg Test, Ur: NEGATIVE

## 2021-02-08 SURGERY — BUNIONECTOMY
Anesthesia: General | Site: Foot | Laterality: Left

## 2021-02-08 MED ORDER — MIDAZOLAM HCL 2 MG/2ML IJ SOLN
INTRAMUSCULAR | Status: AC
  Filled 2021-02-08: qty 2

## 2021-02-08 MED ORDER — ACETAMINOPHEN 500 MG PO TABS
ORAL_TABLET | ORAL | Status: AC
  Filled 2021-02-08: qty 2

## 2021-02-08 MED ORDER — OXYCODONE HCL 5 MG PO TABS: 5.0000 mg | ORAL_TABLET | ORAL | 0 refills | Status: AC | PRN

## 2021-02-08 MED ORDER — DEXAMETHASONE SODIUM PHOSPHATE 10 MG/ML IJ SOLN
INTRAMUSCULAR | Status: DC | PRN
Start: 2021-02-08 — End: 2021-02-08
  Administered 2021-02-08: 4 mg via INTRAVENOUS

## 2021-02-08 MED ORDER — ONDANSETRON HCL 4 MG/2ML IJ SOLN
INTRAMUSCULAR | Status: AC
  Filled 2021-02-08: qty 24

## 2021-02-08 MED ORDER — LIDOCAINE HCL (CARDIAC) PF 100 MG/5ML IV SOSY
PREFILLED_SYRINGE | INTRAVENOUS | Status: DC | PRN
  Administered 2021-02-08: 30 mg via INTRAVENOUS

## 2021-02-08 MED ORDER — BUPIVACAINE-EPINEPHRINE (PF) 0.5% -1:200000 IJ SOLN
INTRAMUSCULAR | Status: DC | PRN
  Administered 2021-02-08: 30 mL via PERINEURAL

## 2021-02-08 MED ORDER — PROPOFOL 10 MG/ML IV BOLUS
INTRAVENOUS | Status: DC | PRN
  Administered 2021-02-08: 150 mg via INTRAVENOUS

## 2021-02-08 MED ORDER — FENTANYL CITRATE (PF) 100 MCG/2ML IJ SOLN
100.0000 ug | Freq: Once | INTRAMUSCULAR | Status: AC
  Administered 2021-02-08: 100 ug via INTRAVENOUS

## 2021-02-08 MED ORDER — 0.9 % SODIUM CHLORIDE (POUR BTL) OPTIME
TOPICAL | Status: DC | PRN
  Administered 2021-02-08: 250 mL

## 2021-02-08 MED ORDER — LIDOCAINE 2% (20 MG/ML) 5 ML SYRINGE
INTRAMUSCULAR | Status: AC
  Filled 2021-02-08: qty 30

## 2021-02-08 MED ORDER — BUPIVACAINE-EPINEPHRINE (PF) 0.5% -1:200000 IJ SOLN
INTRAMUSCULAR | Status: AC
  Filled 2021-02-08: qty 30

## 2021-02-08 MED ORDER — MIDAZOLAM HCL 2 MG/2ML IJ SOLN
2.0000 mg | Freq: Once | INTRAMUSCULAR | Status: AC
  Administered 2021-02-08: 2 mg via INTRAVENOUS

## 2021-02-08 MED ORDER — CEFAZOLIN SODIUM-DEXTROSE 2-4 GM/100ML-% IV SOLN
2.0000 g | INTRAVENOUS | Status: AC
  Administered 2021-02-08: 2 g via INTRAVENOUS

## 2021-02-08 MED ORDER — ASPIRIN EC 81 MG PO TBEC: 81.0000 mg | DELAYED_RELEASE_TABLET | Freq: Two times a day (BID) | ORAL | 0 refills | Status: DC

## 2021-02-08 MED ORDER — ACETAMINOPHEN 500 MG PO TABS
1000.0000 mg | ORAL_TABLET | Freq: Once | ORAL | Status: AC
  Administered 2021-02-08: 1000 mg via ORAL

## 2021-02-08 MED ORDER — VANCOMYCIN HCL 500 MG IV SOLR
INTRAVENOUS | Status: AC
  Filled 2021-02-08: qty 10

## 2021-02-08 MED ORDER — PROPOFOL 500 MG/50ML IV EMUL
INTRAVENOUS | Status: AC
  Filled 2021-02-08: qty 150

## 2021-02-08 MED ORDER — LACTATED RINGERS IV SOLN: INTRAVENOUS | Status: DC

## 2021-02-08 MED ORDER — EPHEDRINE 5 MG/ML INJ
INTRAVENOUS | Status: AC
  Filled 2021-02-08: qty 25

## 2021-02-08 MED ORDER — HYDROMORPHONE HCL 1 MG/ML IJ SOLN: 0.2500 mg | INTRAMUSCULAR | Status: DC | PRN

## 2021-02-08 MED ORDER — PROPOFOL 500 MG/50ML IV EMUL
INTRAVENOUS | Status: DC | PRN
  Administered 2021-02-08: 25 ug/kg/min via INTRAVENOUS

## 2021-02-08 MED ORDER — PHENYLEPHRINE 40 MCG/ML (10ML) SYRINGE FOR IV PUSH (FOR BLOOD PRESSURE SUPPORT)
PREFILLED_SYRINGE | INTRAVENOUS | Status: AC
  Filled 2021-02-08: qty 50

## 2021-02-08 MED ORDER — SODIUM CHLORIDE 0.9 % IV SOLN: INTRAVENOUS | Status: DC

## 2021-02-08 MED ORDER — DEXMEDETOMIDINE (PRECEDEX) IN NS 20 MCG/5ML (4 MCG/ML) IV SYRINGE
PREFILLED_SYRINGE | INTRAVENOUS | Status: AC
  Filled 2021-02-08: qty 10

## 2021-02-08 MED ORDER — FENTANYL CITRATE (PF) 100 MCG/2ML IJ SOLN
INTRAMUSCULAR | Status: AC
  Filled 2021-02-08: qty 2

## 2021-02-08 MED ORDER — CEFAZOLIN SODIUM-DEXTROSE 2-4 GM/100ML-% IV SOLN
INTRAVENOUS | Status: AC
  Filled 2021-02-08: qty 100

## 2021-02-08 MED ORDER — DEXAMETHASONE SODIUM PHOSPHATE 10 MG/ML IJ SOLN
INTRAMUSCULAR | Status: AC
  Filled 2021-02-08: qty 4

## 2021-02-08 MED ORDER — DOCUSATE SODIUM 100 MG PO CAPS: 100.0000 mg | ORAL_CAPSULE | Freq: Two times a day (BID) | ORAL | 0 refills | Status: DC

## 2021-02-08 MED ORDER — VANCOMYCIN HCL 500 MG IV SOLR
INTRAVENOUS | Status: DC | PRN
  Administered 2021-02-08: 500 mg via TOPICAL

## 2021-02-08 MED ORDER — SENNA 8.6 MG PO TABS: 2.0000 | ORAL_TABLET | Freq: Two times a day (BID) | ORAL | 0 refills | Status: DC

## 2021-02-08 MED ORDER — ONDANSETRON HCL 4 MG/2ML IJ SOLN
INTRAMUSCULAR | Status: DC | PRN
  Administered 2021-02-08: 4 mg via INTRAVENOUS

## 2021-02-08 SURGICAL SUPPLY — 91 items
APL PRP STRL LF DISP 70% ISPRP (MISCELLANEOUS) ×2
BANDAGE ESMARK 6X9 LF (GAUZE/BANDAGES/DRESSINGS) IMPLANT
BIT DRILL 2.7XCANN QCK CNCT (BIT) ×1 IMPLANT
BIT DRILL 5/64X5 DISP (BIT) ×3 IMPLANT
BIT DRILL CANN 2.7 (BIT) ×3
BIT DRILL CANN 2.7MM (BIT) ×1
BIT DRL 2.7XCANN QCK CNCT (BIT) ×2
BLADE AVERAGE 25MMX9MM (BLADE)
BLADE AVERAGE 25X9 (BLADE) IMPLANT
BLADE LONG MED 25X9 (BLADE) ×4 IMPLANT
BLADE LONG MED 25X9MM (BLADE) ×1
BLADE MICRO SAGITTAL (BLADE) ×3 IMPLANT
BLADE MINI RND TIP GREEN BEAV (BLADE) IMPLANT
BLADE OSC/SAG .038X5.5 CUT EDG (BLADE) IMPLANT
BLADE SURG 15 STRL LF DISP TIS (BLADE) ×9 IMPLANT
BLADE SURG 15 STRL SS (BLADE) ×12
BNDG CMPR 9X4 STRL LF SNTH (GAUZE/BANDAGES/DRESSINGS)
BNDG CMPR 9X6 STRL LF SNTH (GAUZE/BANDAGES/DRESSINGS)
BNDG COHESIVE 4X5 TAN ST LF (GAUZE/BANDAGES/DRESSINGS) ×2 IMPLANT
BNDG COHESIVE 6X5 TAN ST LF (GAUZE/BANDAGES/DRESSINGS) IMPLANT
BNDG CONFORM 2 STRL LF (GAUZE/BANDAGES/DRESSINGS) IMPLANT
BNDG CONFORM 3 STRL LF (GAUZE/BANDAGES/DRESSINGS) ×5 IMPLANT
BNDG ELASTIC 4X5.8 VLCR STR LF (GAUZE/BANDAGES/DRESSINGS) ×3 IMPLANT
BNDG ELASTIC 6X5.8 VLCR STR LF (GAUZE/BANDAGES/DRESSINGS) ×3 IMPLANT
BNDG ESMARK 4X9 LF (GAUZE/BANDAGES/DRESSINGS) IMPLANT
BNDG ESMARK 6X9 LF (GAUZE/BANDAGES/DRESSINGS)
CHLORAPREP W/TINT 26 (MISCELLANEOUS) ×5 IMPLANT
COVER BACK TABLE 60X90IN (DRAPES) ×5 IMPLANT
CUFF TOURN SGL QUICK 24 (TOURNIQUET CUFF)
CUFF TOURN SGL QUICK 34 (TOURNIQUET CUFF)
CUFF TRNQT CYL 24X4X16.5-23 (TOURNIQUET CUFF) IMPLANT
CUFF TRNQT CYL 34X4.125X (TOURNIQUET CUFF) IMPLANT
DRAPE EXTREMITY T 121X128X90 (DISPOSABLE) ×5 IMPLANT
DRAPE OEC MINIVIEW 54X84 (DRAPES) ×5 IMPLANT
DRAPE U-SHAPE 47X51 STRL (DRAPES) ×5 IMPLANT
DRSG MEPITEL 4X7.2 (GAUZE/BANDAGES/DRESSINGS) ×5 IMPLANT
DRSG PAD ABDOMINAL 8X10 ST (GAUZE/BANDAGES/DRESSINGS) ×5 IMPLANT
ELECT REM PT RETURN 9FT ADLT (ELECTROSURGICAL) ×4
ELECTRODE REM PT RTRN 9FT ADLT (ELECTROSURGICAL) ×3 IMPLANT
GAUZE SPONGE 4X4 12PLY STRL (GAUZE/BANDAGES/DRESSINGS) ×5 IMPLANT
GLOVE SRG 8 PF TXTR STRL LF DI (GLOVE) ×6 IMPLANT
GLOVE SURG ENC MOIS LTX SZ8 (GLOVE) ×5 IMPLANT
GLOVE SURG LTX SZ8 (GLOVE) ×5 IMPLANT
GLOVE SURG POLYISO LF SZ7 (GLOVE) ×3 IMPLANT
GLOVE SURG UNDER POLY LF SZ7 (GLOVE) ×6 IMPLANT
GLOVE SURG UNDER POLY LF SZ8 (GLOVE) ×8
GOWN STRL REUS W/ TWL LRG LVL3 (GOWN DISPOSABLE) ×3 IMPLANT
GOWN STRL REUS W/ TWL XL LVL3 (GOWN DISPOSABLE) ×6 IMPLANT
GOWN STRL REUS W/TWL LRG LVL3 (GOWN DISPOSABLE) ×4
GOWN STRL REUS W/TWL XL LVL3 (GOWN DISPOSABLE) ×8
K-WIRE ACE 1.6X6 (WIRE) ×4
K-WIRE DBL END .054 LG (WIRE) ×5 IMPLANT
KWIRE ACE 1.6X6 (WIRE) ×1 IMPLANT
NDL HYPO 25X1 1.5 SAFETY (NEEDLE) IMPLANT
NEEDLE HYPO 22GX1.5 SAFETY (NEEDLE) IMPLANT
NEEDLE HYPO 25X1 1.5 SAFETY (NEEDLE) IMPLANT
NS IRRIG 1000ML POUR BTL (IV SOLUTION) ×5 IMPLANT
PACK BASIN DAY SURGERY FS (CUSTOM PROCEDURE TRAY) ×5 IMPLANT
PAD CAST 4YDX4 CTTN HI CHSV (CAST SUPPLIES) ×3 IMPLANT
PADDING CAST ABS 4INX4YD NS (CAST SUPPLIES)
PADDING CAST ABS COTTON 4X4 ST (CAST SUPPLIES) IMPLANT
PADDING CAST COTTON 4X4 STRL (CAST SUPPLIES) ×4
PADDING CAST COTTON 6X4 STRL (CAST SUPPLIES) ×3 IMPLANT
PENCIL SMOKE EVACUATOR (MISCELLANEOUS) ×5 IMPLANT
SANITIZER HAND PURELL 535ML FO (MISCELLANEOUS) ×5 IMPLANT
SCREW CANN PT 4X36 NS (Screw) IMPLANT
SCREW CANNULATED NS 4MMX36MM (Screw) ×4 IMPLANT
SCREW HCS TWIST-OFF 2.0X12MM (Screw) ×3 IMPLANT
SCREW LOW PROFILE 3.5MMX42 (Screw) ×3 IMPLANT
SHEET MEDIUM DRAPE 40X70 STRL (DRAPES) ×5 IMPLANT
SLEEVE SCD COMPRESS KNEE MED (STOCKING) ×5 IMPLANT
SPLINT FAST PLASTER 5X30 (CAST SUPPLIES) ×40
SPLINT PLASTER CAST FAST 5X30 (CAST SUPPLIES) ×20 IMPLANT
SPONGE T-LAP 18X18 ~~LOC~~+RFID (SPONGE) ×5 IMPLANT
STOCKINETTE 6  STRL (DRAPES) ×4
STOCKINETTE 6 STRL (DRAPES) ×3 IMPLANT
SUCTION FRAZIER HANDLE 10FR (MISCELLANEOUS) ×4
SUCTION TUBE FRAZIER 10FR DISP (MISCELLANEOUS) ×3 IMPLANT
SUT ETHILON 3 0 PS 1 (SUTURE) ×8 IMPLANT
SUT MNCRL AB 3-0 PS2 18 (SUTURE) ×5 IMPLANT
SUT VIC AB 2-0 SH 27 (SUTURE) ×4
SUT VIC AB 2-0 SH 27XBRD (SUTURE) ×1 IMPLANT
SUT VICRYL 0 SH 27 (SUTURE) IMPLANT
SUT VICRYL 0 UR6 27IN ABS (SUTURE) IMPLANT
SYR BULB EAR ULCER 3OZ GRN STR (SYRINGE) ×5 IMPLANT
SYR CONTROL 10ML LL (SYRINGE) IMPLANT
TOWEL GREEN STERILE FF (TOWEL DISPOSABLE) ×10 IMPLANT
TUBE CONNECTING 20'X1/4 (TUBING) ×1
TUBE CONNECTING 20X1/4 (TUBING) ×2 IMPLANT
UNDERPAD 30X36 HEAVY ABSORB (UNDERPADS AND DIAPERS) ×5 IMPLANT
YANKAUER SUCT BULB TIP NO VENT (SUCTIONS) IMPLANT

## 2021-02-08 NOTE — H&P (Signed)
Kathy Vaughan is an 40 y.o. female.   Chief Complaint: left foot pain HPI: 40 y/o female without significant PMH c/o L forefoot pain for many months.  She has a prominent bunion as well as metatarsalgia and a 2nd hammertoe.  She presents now for surgical treatment having failed all non op treatment to date.  Past Medical History:  Diagnosis Date   ADHD    Depressed     Past Surgical History:  Procedure Laterality Date   WISDOM TOOTH EXTRACTION      History reviewed. No pertinent family history. Social History:  reports that she has never smoked. She has never used smokeless tobacco. She reports that she does not drink alcohol and does not use drugs.  Allergies: No Known Allergies  Medications Prior to Admission  Medication Sig Dispense Refill   Brexpiprazole (REXULTI PO) Take 2 mg by mouth.     DULoxetine HCl (CYMBALTA PO) Take 120 mg by mouth.     ondansetron (ZOFRAN ODT) 4 MG disintegrating tablet Take 1 tablet (4 mg total) by mouth every 8 (eight) hours as needed for nausea. 10 tablet 0   oxyCODONE-acetaminophen (PERCOCET) 5-325 MG tablet Take 1 tablet by mouth every 4 (four) hours as needed. 20 tablet 0   tamsulosin (FLOMAX) 0.4 MG CAPS capsule Take 1 capsule (0.4 mg total) by mouth daily after supper. 10 capsule 0   VYVANSE 60 MG capsule Take 60 mg by mouth daily as needed.     WELLBUTRIN XL 150 MG 24 hr tablet Take 150 mg by mouth daily.      Results for orders placed or performed during the hospital encounter of 2021-03-08 (from the past 48 hour(s))  Pregnancy, urine POC     Status: None   Collection Time: 08-Mar-2021  6:30 AM  Result Value Ref Range   Preg Test, Ur NEGATIVE NEGATIVE    Comment:        THE SENSITIVITY OF THIS METHODOLOGY IS >24 mIU/mL    No results found.  Review of Systems  no recent f/c/n/v/wt loss  Blood pressure 106/71, pulse 71, temperature (!) 97.5 F (36.4 C), temperature source Oral, resp. rate 16, height 5\' 4"  (1.626 m), weight 63.9 kg, SpO2  100 %. Physical Exam  Wn wd woman in nad.  A and O x 4.  Normal mood and affect.  EOMI.  Resp unlabored.  L foot with prominent bunion.  Skin healthy and intact.  No lymphadenopathy.  Pulses in the left foot are palpable.  Normal sens to LT dorsally and platnarly.  Assessment/Plan L bunion, metatarsalgia and 2nd hammertoe.  To the OR today for surgical treatment.  The risks and benefits of the alternative treatment options have been discussed in detail.  The patient wishes to proceed with surgery and specifically understands risks of bleeding, infection, nerve damage, blood clots, need for additional surgery, amputation and death.   , MD 03/08/2021, 7:23 AM

## 2021-02-08 NOTE — Op Note (Signed)
02/08/2021  8:53 AM  PATIENT:  Kathy Vaughan  40 y.o. female  PRE-OPERATIVE DIAGNOSIS: 1.  Painful left foot bunion deformity 2.  Left forefoot metatarsalgia 3.  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  Left foot bunion correction with Lapidus arthrodesis of the first TMT joint 2.  Left second metatarsal Weil osteotomy 3.  Left second toe percutaneous FDL tenotomy 4.  Left second hammertoe correction with flexor to extensor transfer 5.  AP, lateral and oblique radiographs of the left foot  SURGEON:  Toni Arthurs, MD  ASSISTANT: Alfredo Martinez, PA-C  ANESTHESIA:   General, regional  EBL:  minimal   TOURNIQUET: 55 minutes at 250 mmHg thigh  COMPLICATIONS:  None apparent  DISPOSITION:  Extubated, awake and stable to recovery.  INDICATION FOR PROCEDURE: The patient is a 40 year old female without significant past medical history.  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  She has signs and symptoms of metatarsalgia as well.  She has failed nonoperative treatment to date including activity modification, oral anti-inflammatories and shoewear modification.  She presents today for surgical correction of these painful left forefoot deformities.  The risks and benefits of the alternative treatment options have been discussed in detail.  The patient wishes to proceed with surgery and specifically understands risks of bleeding, infection, nerve damage, blood clots, need for additional surgery, amputation and death.   PROCEDURE IN DETAIL:  After pre operative consent was obtained, and the correct operative site was identified, the patient was brought to the operating room and placed supine on the OR table.  Anesthesia was administered.  Pre-operative antibiotics were administered.  A surgical timeout was taken.  The  left lower extremity was prepped and draped in standard sterile fashion with a tourniquet around the thigh.  The extremity was elevated and the tourniquet was inflated to 250 mmHg.  A longitudinal incision was made just medial to the second MTP joint.  Dissection was carried down into the first webspace.  The intermetatarsal ligament was divided under direct vision.  An arthrotomy was then made between the lateral sesamoid and the first metatarsal head.  Attention was turned to the medial forefoot where a longitudinal incision was made over the medial eminence.  Dissection was carried sharply down through the subcutaneous tissues.  The medial joint capsule was incised and elevated dorsally and plantarly.  The hypertrophic medial eminence was resected in line with the first metatarsal shaft with the oscillating saw.  Attention was turned to the dorsum of the midfoot where a longitudinal incision was made over the first TMT joint.  Dissection was carried sharply down through the subcutaneous tissues into the joint capsule.  The joint capsule was incised and elevated medially and laterally.  The base of the first metatarsal and the distal articular surface of the medial cuneiform were resected with the oscillating saw taking more bone laterally than medially in order to correct the intermetatarsal angle.  The joint was irrigated copiously.  Both sides of the joint were perforated with a small drill bit leaving the resultant bone graft in place.  The joint was reduced and provisionally pinned.  Radiographs confirmed correction of the intermetatarsal and hallux valgus angles.  Joint was then compressed with a 4 mm partially-threaded cannulated screw from the [employer] titanium screw set.  A 3.5 mm LPS screw was  then placed from distal to proximal as a position screw.  Radiographs confirmed appropriate reduction of the joint and correction of the bunion deformity as well as position and length of both  screws.  Attention was turned to the second MTP joint.  The dorsal joint capsule was excised and the dorsal limb of the collateral ligaments was released.  A Weil osteotomy was made with the oscillating saw.  The head was allowed to retract proximally and was fixed with a 2 mm FRS screw.  Overhanging bone was trimmed with a rondure.  The plantar plate was carefully examined.  There was a central tear which was debrided with a curette.  The flexor digitorum longus tendon was released from the distal phalanx through a small plantar incision at the flexor crease.  An oblique incision was then made beneath the base of the proximal phalanx.  Dissection was carried in the midline down to the flexor tendon sheath.  It was incised.  The flexor digitorum longus was then retracted out through this incision and passed dorsally to the dorsal cortex of the proximal phalanx.  The joint was reduced and the tendon sewn securely to the periosteum and the extensor digitorum longus tendon.  The MTP joint was noted to be stable to shuck testing.  Final AP, oblique and lateral radiographs of the right foot showed appropriate correction of the bunion deformity and shortening of the second metatarsal.  Hardware is appropriately positioned and of the appropriate lengths.  No other acute injuries were noted.  The wounds were irrigated copiously and sprinkled with vancomycin powder.  Subcutaneous tissues were approximated with Monocryl after repairing the medial joint capsule with imbricating sutures of 2-0 Vicryl.  Skin incisions were closed with nylon.  Sterile dressings were applied followed by a well-padded short leg splint.  The tourniquet was released after application of the dressings.  The patient was awakened from anesthesia and transported to the recovery room in stable condition.   FOLLOW UP PLAN: Nonweightbearing on the left lower extremity.  Follow-up in the office in 2 weeks for suture removal and conversion to a  short leg cast with a toe spacer.  Aspirin for DVT prophylaxis.   RADIOGRAPHS: AP, oblique and lateral radiographs of the right foot were obtained intraoperatively and showed appropriate correction of the bunion deformity and shortening of the second metatarsal.  Hardware is appropriately positioned and of the appropriate lengths.  No other acute injuries were noted.    Alfredo Martinez PA-C was present and scrubbed for the duration of the operative case. His assistance was essential in positioning the patient, prepping and draping, gaining and maintaining exposure, performing the operation, closing and dressing the wounds and applying the splint.

## 2021-02-08 NOTE — Discharge Instructions (Addendum)
Toni Arthurs, MD [facility]  Please read the following information regarding your care after surgery.  Medications  You only need a prescription for the narcotic pain medicine (ex. oxycodone, Percocet, Norco).  All of the other medicines listed below are available over the counter. ? Aleve 2 pills twice a day for the first 3 days after surgery. ? acetominophen (Tylenol) 650 mg every 4-6 hours as you need for minor to moderate pain ? oxycodone as prescribed for severe pain  Narcotic pain medicine (ex. oxycodone, Percocet, Vicodin) will cause constipation.  To prevent this problem, take the following medicines while you are taking any pain medicine. ? docusate sodium (Colace) 100 mg twice a day ? senna (Senokot) 2 tablets twice a day  ? To help prevent blood clots, take a baby aspirin (81 mg) twice a day for six weeks after surgery.  You should also get up every hour while you are awake to move around.    Weight Bearing ? Do not bear any weight on the operated leg or foot.  Cast / Splint / Dressing ? Keep your splint, cast or dressing clean and dry.  Dont put anything (coat hanger, pencil, etc) down inside of it.  If it gets damp, use a hair dryer on the cool setting to dry it.  If it gets soaked, call the office to schedule an appointment for a cast change.   After your dressing, cast or splint is removed; you may shower, but do not soak or scrub the wound.  Allow the water to run over it, and then gently pat it dry.  Swelling It is normal for you to have swelling where you had surgery.  To reduce swelling and pain, keep your toes above your nose for at least 3 days after surgery.  It may be necessary to keep your foot or leg elevated for several weeks.  If it hurts, it should be elevated.  Follow Up Call my office at [phone] when you are discharged from the hospital or surgery center to schedule an appointment to be seen two weeks after surgery.  Call my office at [phone] if  you develop a fever >101.5 F, nausea, vomiting, bleeding from the surgical site or severe pain.         Post Anesthesia Home Care Instructions  Activity: Get plenty of rest for the remainder of the day. A responsible individual must stay with you for 24 hours following the procedure.  For the next 24 hours, DO NOT: -Drive a car -Advertising copywriter -Drink alcoholic beverages -Take any medication unless instructed by your physician -Make any legal decisions or sign important papers.  Meals: Start with liquid foods such as gelatin or soup. Progress to regular foods as tolerated. Avoid greasy, spicy, heavy foods. If nausea and/or vomiting occur, drink only clear liquids until the nausea and/or vomiting subsides. Call your physician if vomiting continues.  Special Instructions/Symptoms: Your throat may feel dry or sore from the anesthesia or the breathing tube placed in your throat during surgery. If this causes discomfort, gargle with warm salt water. The discomfort should disappear within 24 hours.  If you had a scopolamine patch placed behind your ear for the management of post- operative nausea and/or vomiting:  1. The medication in the patch is effective for 72 hours, after which it should be removed.  Wrap patch in a tissue and discard in the trash. Wash hands thoroughly with soap and water. 2. You may remove the patch earlier than 72  hours if you experience unpleasant side effects which may include dry mouth, dizziness or visual disturbances. 3. Avoid touching the patch. Wash your hands with soap and water after contact with the patch.           Next dose of Tylenol can be given after 12:55 PM.

## 2021-02-08 NOTE — Progress Notes (Signed)
Assisted Dr. Edmond Fitzgerald with left, ultrasound guided, popliteal/saphenous block. Side rails up, monitors on throughout procedure. See vital signs in flow sheet. Tolerated Procedure well. 

## 2021-02-08 NOTE — Transfer of Care (Signed)
Immediate Anesthesia Transfer of Care Note  Patient: Kathy Vaughan  Procedure(s) Performed: Left Lapidus and Modified McBride Bunionectomy (Left: Foot) Second Weil Osteotomy and Hammertoe Correction with Flexor Tenotomy (Left: Foot)  Patient Location: PACU  Anesthesia Type:GA combined with regional for post-op pain  Level of Consciousness: drowsy and patient cooperative  Airway & Oxygen Therapy: Patient Spontanous Breathing and Patient connected to face mask oxygen  Post-op Assessment: Report given to RN and Post -op Vital signs reviewed and stable  Post vital signs: Reviewed and stable  Last Vitals:  Vitals Value Taken Time  BP 93/48 02/08/21 0853  Temp    Pulse 78 02/08/21 0854  Resp 15 02/08/21 0854  SpO2 100 % 02/08/21 0854  Vitals shown include unvalidated device data.  Last Pain:  Vitals:   02/08/21 0715  TempSrc:   PainSc: 0-No pain      Patients Stated Pain Goal: 3 (02/08/21 5361)  Complications: No notable events documented.

## 2021-02-08 NOTE — Anesthesia Procedure Notes (Signed)
Anesthesia Regional Block: Popliteal block   Pre-Anesthetic Checklist: , timeout performed,  Correct Patient, Correct Site, Correct Laterality,  Correct Procedure, Correct Position, site marked,  Risks and benefits discussed,  Pre-op evaluation,  At surgeon's request and post-op pain management  Laterality: Left  Prep: Maximum Sterile Barrier Precautions used, chloraprep       Needles:  Injection technique: Single-shot  Needle Type: Echogenic Stimulator Needle     Needle Length: 9cm  Needle Gauge: 21     Additional Needles:   Procedures:, nerve stimulator,,, ultrasound used (permanent image in chart),,     Nerve Stimulator or Paresthesia:  Response: Peroneal Response: Tibial  Additional Responses:   Narrative:  Start time: 02/08/2021 7:01 AM End time: 02/08/2021 7:11 AM Injection made incrementally with aspirations every 5 mL.  Performed by: Personally  Anesthesiologist: Gaynelle Adu, MD

## 2021-02-08 NOTE — Anesthesia Procedure Notes (Signed)
Procedure Name: LMA Insertion Date/Time: 02/08/2021 7:39 AM Performed by: Sheryn Bison, CRNA Pre-anesthesia Checklist: Patient identified, Emergency Drugs available, Suction available and Patient being monitored Patient Re-evaluated:Patient Re-evaluated prior to induction Oxygen Delivery Method: Circle System Utilized Preoxygenation: Pre-oxygenation with 100% oxygen Induction Type: IV induction Ventilation: Mask ventilation without difficulty LMA: LMA inserted LMA Size: 4.0 Number of attempts: 1 Airway Equipment and Method: bite block Placement Confirmation: positive ETCO2 Tube secured with: Tape Dental Injury: Teeth and Oropharynx as per pre-operative assessment

## 2021-02-08 NOTE — Anesthesia Postprocedure Evaluation (Signed)
Anesthesia Post Note  Patient: Kathy Vaughan  Procedure(s) Performed: Left Lapidus and Modified McBride Bunionectomy (Left: Foot) Second Weil Osteotomy and Hammertoe Correction with Flexor Tenotomy (Left: Foot)     Patient location during evaluation: PACU Anesthesia Type: General and Regional Level of consciousness: awake and alert Pain management: pain level controlled Vital Signs Assessment: post-procedure vital signs reviewed and stable Respiratory status: spontaneous breathing, nonlabored ventilation and respiratory function stable Cardiovascular status: blood pressure returned to baseline and stable Postop Assessment: no apparent nausea or vomiting Anesthetic complications: no   No notable events documented.  Last Vitals:  Vitals:   02/08/21 0915 02/08/21 0949  BP: (!) 114/57 131/68  Pulse: 83 91  Resp: 12 16  Temp:  (!) 36.4 C  SpO2: 100% 98%    Last Pain:  Vitals:   02/08/21 0949  TempSrc:   PainSc: 0-No pain    LLE Motor Response: No movement due to regional block (02/08/21 0945) LLE Sensation: Decreased;No pain (02/08/21 0945)          Eris Hannan,W. EDMOND

## 2021-02-12 ENCOUNTER — Encounter (HOSPITAL_BASED_OUTPATIENT_CLINIC_OR_DEPARTMENT_OTHER): Payer: Self-pay | Admitting: Orthopedic Surgery

## 2021-02-21 DIAGNOSIS — N200 Calculus of kidney: Secondary | ICD-10-CM | POA: Diagnosis not present

## 2021-02-21 DIAGNOSIS — M21612 Bunion of left foot: Secondary | ICD-10-CM | POA: Diagnosis not present

## 2021-02-28 DIAGNOSIS — F330 Major depressive disorder, recurrent, mild: Secondary | ICD-10-CM | POA: Diagnosis not present

## 2021-03-02 DIAGNOSIS — N202 Calculus of kidney with calculus of ureter: Secondary | ICD-10-CM | POA: Diagnosis not present

## 2021-03-12 DIAGNOSIS — F330 Major depressive disorder, recurrent, mild: Secondary | ICD-10-CM | POA: Diagnosis not present

## 2021-03-21 DIAGNOSIS — F331 Major depressive disorder, recurrent, moderate: Secondary | ICD-10-CM | POA: Diagnosis not present

## 2021-03-21 DIAGNOSIS — F900 Attention-deficit hyperactivity disorder, predominantly inattentive type: Secondary | ICD-10-CM | POA: Diagnosis not present

## 2021-03-26 DIAGNOSIS — F900 Attention-deficit hyperactivity disorder, predominantly inattentive type: Secondary | ICD-10-CM | POA: Diagnosis not present

## 2021-03-26 DIAGNOSIS — F331 Major depressive disorder, recurrent, moderate: Secondary | ICD-10-CM | POA: Diagnosis not present

## 2021-03-26 DIAGNOSIS — M2022 Hallux rigidus, left foot: Secondary | ICD-10-CM | POA: Diagnosis not present

## 2021-04-23 DIAGNOSIS — M2022 Hallux rigidus, left foot: Secondary | ICD-10-CM | POA: Diagnosis not present

## 2021-04-27 DIAGNOSIS — F900 Attention-deficit hyperactivity disorder, predominantly inattentive type: Secondary | ICD-10-CM | POA: Diagnosis not present

## 2021-04-27 DIAGNOSIS — F331 Major depressive disorder, recurrent, moderate: Secondary | ICD-10-CM | POA: Diagnosis not present

## 2021-05-08 DIAGNOSIS — M25522 Pain in left elbow: Secondary | ICD-10-CM | POA: Diagnosis not present

## 2021-05-08 DIAGNOSIS — M25512 Pain in left shoulder: Secondary | ICD-10-CM | POA: Diagnosis not present

## 2021-05-08 DIAGNOSIS — M7712 Lateral epicondylitis, left elbow: Secondary | ICD-10-CM | POA: Diagnosis not present

## 2021-05-08 DIAGNOSIS — M7542 Impingement syndrome of left shoulder: Secondary | ICD-10-CM | POA: Diagnosis not present

## 2021-05-11 DIAGNOSIS — M2022 Hallux rigidus, left foot: Secondary | ICD-10-CM | POA: Diagnosis not present

## 2021-05-11 DIAGNOSIS — R262 Difficulty in walking, not elsewhere classified: Secondary | ICD-10-CM | POA: Diagnosis not present

## 2021-05-11 DIAGNOSIS — F900 Attention-deficit hyperactivity disorder, predominantly inattentive type: Secondary | ICD-10-CM | POA: Diagnosis not present

## 2021-05-11 DIAGNOSIS — F331 Major depressive disorder, recurrent, moderate: Secondary | ICD-10-CM | POA: Diagnosis not present

## 2021-05-11 DIAGNOSIS — R531 Weakness: Secondary | ICD-10-CM | POA: Diagnosis not present

## 2021-05-11 DIAGNOSIS — M25572 Pain in left ankle and joints of left foot: Secondary | ICD-10-CM | POA: Diagnosis not present

## 2021-05-16 DIAGNOSIS — M2022 Hallux rigidus, left foot: Secondary | ICD-10-CM | POA: Diagnosis not present

## 2021-05-16 DIAGNOSIS — M25572 Pain in left ankle and joints of left foot: Secondary | ICD-10-CM | POA: Diagnosis not present

## 2021-05-16 DIAGNOSIS — R262 Difficulty in walking, not elsewhere classified: Secondary | ICD-10-CM | POA: Diagnosis not present

## 2021-05-16 DIAGNOSIS — R531 Weakness: Secondary | ICD-10-CM | POA: Diagnosis not present

## 2021-05-17 DIAGNOSIS — M25572 Pain in left ankle and joints of left foot: Secondary | ICD-10-CM | POA: Diagnosis not present

## 2021-05-17 DIAGNOSIS — R531 Weakness: Secondary | ICD-10-CM | POA: Diagnosis not present

## 2021-05-17 DIAGNOSIS — R262 Difficulty in walking, not elsewhere classified: Secondary | ICD-10-CM | POA: Diagnosis not present

## 2021-05-17 DIAGNOSIS — M2022 Hallux rigidus, left foot: Secondary | ICD-10-CM | POA: Diagnosis not present

## 2021-05-21 DIAGNOSIS — M2022 Hallux rigidus, left foot: Secondary | ICD-10-CM | POA: Diagnosis not present

## 2021-05-22 DIAGNOSIS — R531 Weakness: Secondary | ICD-10-CM | POA: Diagnosis not present

## 2021-05-22 DIAGNOSIS — M2022 Hallux rigidus, left foot: Secondary | ICD-10-CM | POA: Diagnosis not present

## 2021-05-22 DIAGNOSIS — R262 Difficulty in walking, not elsewhere classified: Secondary | ICD-10-CM | POA: Diagnosis not present

## 2021-05-22 DIAGNOSIS — M25572 Pain in left ankle and joints of left foot: Secondary | ICD-10-CM | POA: Diagnosis not present

## 2021-05-24 DIAGNOSIS — F900 Attention-deficit hyperactivity disorder, predominantly inattentive type: Secondary | ICD-10-CM | POA: Diagnosis not present

## 2021-05-24 DIAGNOSIS — F331 Major depressive disorder, recurrent, moderate: Secondary | ICD-10-CM | POA: Diagnosis not present

## 2021-05-31 DIAGNOSIS — R262 Difficulty in walking, not elsewhere classified: Secondary | ICD-10-CM | POA: Diagnosis not present

## 2021-05-31 DIAGNOSIS — M2022 Hallux rigidus, left foot: Secondary | ICD-10-CM | POA: Diagnosis not present

## 2021-05-31 DIAGNOSIS — R531 Weakness: Secondary | ICD-10-CM | POA: Diagnosis not present

## 2021-05-31 DIAGNOSIS — M25572 Pain in left ankle and joints of left foot: Secondary | ICD-10-CM | POA: Diagnosis not present

## 2021-06-05 DIAGNOSIS — R262 Difficulty in walking, not elsewhere classified: Secondary | ICD-10-CM | POA: Diagnosis not present

## 2021-06-05 DIAGNOSIS — M25572 Pain in left ankle and joints of left foot: Secondary | ICD-10-CM | POA: Diagnosis not present

## 2021-06-05 DIAGNOSIS — R531 Weakness: Secondary | ICD-10-CM | POA: Diagnosis not present

## 2021-06-05 DIAGNOSIS — M2022 Hallux rigidus, left foot: Secondary | ICD-10-CM | POA: Diagnosis not present

## 2021-06-14 DIAGNOSIS — F331 Major depressive disorder, recurrent, moderate: Secondary | ICD-10-CM | POA: Diagnosis not present

## 2021-06-14 DIAGNOSIS — F900 Attention-deficit hyperactivity disorder, predominantly inattentive type: Secondary | ICD-10-CM | POA: Diagnosis not present

## 2021-06-15 DIAGNOSIS — R531 Weakness: Secondary | ICD-10-CM | POA: Diagnosis not present

## 2021-06-15 DIAGNOSIS — M25572 Pain in left ankle and joints of left foot: Secondary | ICD-10-CM | POA: Diagnosis not present

## 2021-06-15 DIAGNOSIS — M2022 Hallux rigidus, left foot: Secondary | ICD-10-CM | POA: Diagnosis not present

## 2021-06-15 DIAGNOSIS — R262 Difficulty in walking, not elsewhere classified: Secondary | ICD-10-CM | POA: Diagnosis not present

## 2021-06-19 DIAGNOSIS — M7712 Lateral epicondylitis, left elbow: Secondary | ICD-10-CM | POA: Diagnosis not present

## 2021-06-19 DIAGNOSIS — M7542 Impingement syndrome of left shoulder: Secondary | ICD-10-CM | POA: Diagnosis not present

## 2021-06-22 DIAGNOSIS — R262 Difficulty in walking, not elsewhere classified: Secondary | ICD-10-CM | POA: Diagnosis not present

## 2021-06-22 DIAGNOSIS — M25572 Pain in left ankle and joints of left foot: Secondary | ICD-10-CM | POA: Diagnosis not present

## 2021-06-22 DIAGNOSIS — R531 Weakness: Secondary | ICD-10-CM | POA: Diagnosis not present

## 2021-06-22 DIAGNOSIS — M2022 Hallux rigidus, left foot: Secondary | ICD-10-CM | POA: Diagnosis not present

## 2021-06-25 DIAGNOSIS — F900 Attention-deficit hyperactivity disorder, predominantly inattentive type: Secondary | ICD-10-CM | POA: Diagnosis not present

## 2021-06-25 DIAGNOSIS — F331 Major depressive disorder, recurrent, moderate: Secondary | ICD-10-CM | POA: Diagnosis not present

## 2021-06-26 DIAGNOSIS — M2022 Hallux rigidus, left foot: Secondary | ICD-10-CM | POA: Diagnosis not present

## 2021-06-26 DIAGNOSIS — R262 Difficulty in walking, not elsewhere classified: Secondary | ICD-10-CM | POA: Diagnosis not present

## 2021-06-26 DIAGNOSIS — R531 Weakness: Secondary | ICD-10-CM | POA: Diagnosis not present

## 2021-06-26 DIAGNOSIS — M25572 Pain in left ankle and joints of left foot: Secondary | ICD-10-CM | POA: Diagnosis not present

## 2021-06-27 DIAGNOSIS — F900 Attention-deficit hyperactivity disorder, predominantly inattentive type: Secondary | ICD-10-CM | POA: Diagnosis not present

## 2021-06-27 DIAGNOSIS — F3342 Major depressive disorder, recurrent, in full remission: Secondary | ICD-10-CM | POA: Diagnosis not present

## 2021-07-04 DIAGNOSIS — R531 Weakness: Secondary | ICD-10-CM | POA: Diagnosis not present

## 2021-07-04 DIAGNOSIS — M25572 Pain in left ankle and joints of left foot: Secondary | ICD-10-CM | POA: Diagnosis not present

## 2021-07-04 DIAGNOSIS — R262 Difficulty in walking, not elsewhere classified: Secondary | ICD-10-CM | POA: Diagnosis not present

## 2021-07-04 DIAGNOSIS — M2022 Hallux rigidus, left foot: Secondary | ICD-10-CM | POA: Diagnosis not present

## 2021-07-06 DIAGNOSIS — F3342 Major depressive disorder, recurrent, in full remission: Secondary | ICD-10-CM | POA: Diagnosis not present

## 2021-07-06 DIAGNOSIS — F900 Attention-deficit hyperactivity disorder, predominantly inattentive type: Secondary | ICD-10-CM | POA: Diagnosis not present

## 2021-07-11 DIAGNOSIS — R262 Difficulty in walking, not elsewhere classified: Secondary | ICD-10-CM | POA: Diagnosis not present

## 2021-07-11 DIAGNOSIS — R531 Weakness: Secondary | ICD-10-CM | POA: Diagnosis not present

## 2021-07-11 DIAGNOSIS — M2022 Hallux rigidus, left foot: Secondary | ICD-10-CM | POA: Diagnosis not present

## 2021-07-11 DIAGNOSIS — M25572 Pain in left ankle and joints of left foot: Secondary | ICD-10-CM | POA: Diagnosis not present

## 2021-07-18 DIAGNOSIS — R262 Difficulty in walking, not elsewhere classified: Secondary | ICD-10-CM | POA: Diagnosis not present

## 2021-07-18 DIAGNOSIS — M2022 Hallux rigidus, left foot: Secondary | ICD-10-CM | POA: Diagnosis not present

## 2021-07-18 DIAGNOSIS — R531 Weakness: Secondary | ICD-10-CM | POA: Diagnosis not present

## 2021-07-18 DIAGNOSIS — M25572 Pain in left ankle and joints of left foot: Secondary | ICD-10-CM | POA: Diagnosis not present

## 2021-07-20 DIAGNOSIS — F900 Attention-deficit hyperactivity disorder, predominantly inattentive type: Secondary | ICD-10-CM | POA: Diagnosis not present

## 2021-07-20 DIAGNOSIS — F3342 Major depressive disorder, recurrent, in full remission: Secondary | ICD-10-CM | POA: Diagnosis not present

## 2021-07-24 DIAGNOSIS — F900 Attention-deficit hyperactivity disorder, predominantly inattentive type: Secondary | ICD-10-CM | POA: Diagnosis not present

## 2021-07-24 DIAGNOSIS — F3342 Major depressive disorder, recurrent, in full remission: Secondary | ICD-10-CM | POA: Diagnosis not present

## 2021-07-25 DIAGNOSIS — R262 Difficulty in walking, not elsewhere classified: Secondary | ICD-10-CM | POA: Diagnosis not present

## 2021-07-25 DIAGNOSIS — R531 Weakness: Secondary | ICD-10-CM | POA: Diagnosis not present

## 2021-07-25 DIAGNOSIS — M25572 Pain in left ankle and joints of left foot: Secondary | ICD-10-CM | POA: Diagnosis not present

## 2021-07-25 DIAGNOSIS — M2022 Hallux rigidus, left foot: Secondary | ICD-10-CM | POA: Diagnosis not present

## 2021-07-27 DIAGNOSIS — F900 Attention-deficit hyperactivity disorder, predominantly inattentive type: Secondary | ICD-10-CM | POA: Diagnosis not present

## 2021-07-27 DIAGNOSIS — F3342 Major depressive disorder, recurrent, in full remission: Secondary | ICD-10-CM | POA: Diagnosis not present

## 2021-07-30 DIAGNOSIS — Z79899 Other long term (current) drug therapy: Secondary | ICD-10-CM | POA: Diagnosis not present

## 2021-08-08 DIAGNOSIS — R531 Weakness: Secondary | ICD-10-CM | POA: Diagnosis not present

## 2021-08-08 DIAGNOSIS — M25572 Pain in left ankle and joints of left foot: Secondary | ICD-10-CM | POA: Diagnosis not present

## 2021-08-08 DIAGNOSIS — R262 Difficulty in walking, not elsewhere classified: Secondary | ICD-10-CM | POA: Diagnosis not present

## 2021-08-08 DIAGNOSIS — M2022 Hallux rigidus, left foot: Secondary | ICD-10-CM | POA: Diagnosis not present

## 2021-08-13 DIAGNOSIS — L600 Ingrowing nail: Secondary | ICD-10-CM | POA: Diagnosis not present

## 2021-08-15 DIAGNOSIS — F900 Attention-deficit hyperactivity disorder, predominantly inattentive type: Secondary | ICD-10-CM | POA: Diagnosis not present

## 2021-08-15 DIAGNOSIS — F3342 Major depressive disorder, recurrent, in full remission: Secondary | ICD-10-CM | POA: Diagnosis not present

## 2021-08-21 DIAGNOSIS — F330 Major depressive disorder, recurrent, mild: Secondary | ICD-10-CM | POA: Diagnosis not present

## 2021-08-23 DIAGNOSIS — M25572 Pain in left ankle and joints of left foot: Secondary | ICD-10-CM | POA: Diagnosis not present

## 2021-08-23 DIAGNOSIS — M2022 Hallux rigidus, left foot: Secondary | ICD-10-CM | POA: Diagnosis not present

## 2021-08-23 DIAGNOSIS — R531 Weakness: Secondary | ICD-10-CM | POA: Diagnosis not present

## 2021-08-23 DIAGNOSIS — R262 Difficulty in walking, not elsewhere classified: Secondary | ICD-10-CM | POA: Diagnosis not present

## 2021-08-30 DIAGNOSIS — F900 Attention-deficit hyperactivity disorder, predominantly inattentive type: Secondary | ICD-10-CM | POA: Diagnosis not present

## 2021-08-30 DIAGNOSIS — F330 Major depressive disorder, recurrent, mild: Secondary | ICD-10-CM | POA: Diagnosis not present

## 2021-09-05 DIAGNOSIS — Z79899 Other long term (current) drug therapy: Secondary | ICD-10-CM | POA: Diagnosis not present

## 2021-09-05 DIAGNOSIS — R944 Abnormal results of kidney function studies: Secondary | ICD-10-CM | POA: Diagnosis not present

## 2021-09-12 DIAGNOSIS — M2022 Hallux rigidus, left foot: Secondary | ICD-10-CM | POA: Diagnosis not present

## 2021-09-12 DIAGNOSIS — R262 Difficulty in walking, not elsewhere classified: Secondary | ICD-10-CM | POA: Diagnosis not present

## 2021-09-12 DIAGNOSIS — M25572 Pain in left ankle and joints of left foot: Secondary | ICD-10-CM | POA: Diagnosis not present

## 2021-09-12 DIAGNOSIS — R531 Weakness: Secondary | ICD-10-CM | POA: Diagnosis not present

## 2021-09-13 DIAGNOSIS — F330 Major depressive disorder, recurrent, mild: Secondary | ICD-10-CM | POA: Diagnosis not present

## 2021-09-13 DIAGNOSIS — F900 Attention-deficit hyperactivity disorder, predominantly inattentive type: Secondary | ICD-10-CM | POA: Diagnosis not present

## 2021-09-19 DIAGNOSIS — M2022 Hallux rigidus, left foot: Secondary | ICD-10-CM | POA: Diagnosis not present

## 2021-09-19 DIAGNOSIS — M25572 Pain in left ankle and joints of left foot: Secondary | ICD-10-CM | POA: Diagnosis not present

## 2021-09-19 DIAGNOSIS — R531 Weakness: Secondary | ICD-10-CM | POA: Diagnosis not present

## 2021-09-19 DIAGNOSIS — R262 Difficulty in walking, not elsewhere classified: Secondary | ICD-10-CM | POA: Diagnosis not present

## 2021-10-09 DIAGNOSIS — F330 Major depressive disorder, recurrent, mild: Secondary | ICD-10-CM | POA: Diagnosis not present

## 2021-10-09 DIAGNOSIS — F900 Attention-deficit hyperactivity disorder, predominantly inattentive type: Secondary | ICD-10-CM | POA: Diagnosis not present

## 2021-10-15 ENCOUNTER — Other Ambulatory Visit: Payer: Self-pay | Admitting: Family Medicine

## 2021-10-15 DIAGNOSIS — N289 Disorder of kidney and ureter, unspecified: Secondary | ICD-10-CM

## 2021-10-17 ENCOUNTER — Ambulatory Visit
Admission: RE | Admit: 2021-10-17 | Discharge: 2021-10-17 | Disposition: A | Discharge status: Home / Self Care | Payer: BC Managed Care – PPO | Source: Ambulatory Visit | Attending: Family Medicine | Admitting: Family Medicine

## 2021-10-17 DIAGNOSIS — N281 Cyst of kidney, acquired: Secondary | ICD-10-CM | POA: Diagnosis not present

## 2021-10-17 DIAGNOSIS — N261 Atrophy of kidney (terminal): Secondary | ICD-10-CM | POA: Diagnosis not present

## 2021-10-17 DIAGNOSIS — N200 Calculus of kidney: Secondary | ICD-10-CM | POA: Diagnosis not present

## 2021-10-17 DIAGNOSIS — N289 Disorder of kidney and ureter, unspecified: Secondary | ICD-10-CM

## 2021-10-26 DIAGNOSIS — N281 Cyst of kidney, acquired: Secondary | ICD-10-CM | POA: Diagnosis not present

## 2021-10-26 DIAGNOSIS — N200 Calculus of kidney: Secondary | ICD-10-CM | POA: Diagnosis not present

## 2021-11-09 DIAGNOSIS — F900 Attention-deficit hyperactivity disorder, predominantly inattentive type: Secondary | ICD-10-CM | POA: Diagnosis not present

## 2021-11-09 DIAGNOSIS — F330 Major depressive disorder, recurrent, mild: Secondary | ICD-10-CM | POA: Diagnosis not present

## 2021-11-23 DIAGNOSIS — F330 Major depressive disorder, recurrent, mild: Secondary | ICD-10-CM | POA: Diagnosis not present

## 2021-11-23 DIAGNOSIS — F900 Attention-deficit hyperactivity disorder, predominantly inattentive type: Secondary | ICD-10-CM | POA: Diagnosis not present

## 2021-11-28 DIAGNOSIS — F900 Attention-deficit hyperactivity disorder, predominantly inattentive type: Secondary | ICD-10-CM | POA: Diagnosis not present

## 2021-11-28 DIAGNOSIS — F331 Major depressive disorder, recurrent, moderate: Secondary | ICD-10-CM | POA: Diagnosis not present

## 2021-12-11 DIAGNOSIS — F331 Major depressive disorder, recurrent, moderate: Secondary | ICD-10-CM | POA: Diagnosis not present

## 2021-12-11 DIAGNOSIS — F900 Attention-deficit hyperactivity disorder, predominantly inattentive type: Secondary | ICD-10-CM | POA: Diagnosis not present

## 2021-12-12 DIAGNOSIS — M7672 Peroneal tendinitis, left leg: Secondary | ICD-10-CM | POA: Diagnosis not present

## 2021-12-12 DIAGNOSIS — M2022 Hallux rigidus, left foot: Secondary | ICD-10-CM | POA: Diagnosis not present

## 2021-12-12 DIAGNOSIS — M79672 Pain in left foot: Secondary | ICD-10-CM | POA: Diagnosis not present

## 2021-12-25 DIAGNOSIS — N200 Calculus of kidney: Secondary | ICD-10-CM | POA: Diagnosis not present

## 2021-12-25 DIAGNOSIS — N261 Atrophy of kidney (terminal): Secondary | ICD-10-CM | POA: Diagnosis not present

## 2021-12-25 DIAGNOSIS — N179 Acute kidney failure, unspecified: Secondary | ICD-10-CM | POA: Diagnosis not present

## 2021-12-26 ENCOUNTER — Other Ambulatory Visit: Payer: Self-pay | Admitting: Nephrology

## 2021-12-26 DIAGNOSIS — N179 Acute kidney failure, unspecified: Secondary | ICD-10-CM

## 2021-12-27 ENCOUNTER — Telehealth: Payer: Self-pay | Admitting: *Deleted

## 2021-12-27 DIAGNOSIS — R262 Difficulty in walking, not elsewhere classified: Secondary | ICD-10-CM | POA: Diagnosis not present

## 2021-12-27 DIAGNOSIS — M2022 Hallux rigidus, left foot: Secondary | ICD-10-CM | POA: Diagnosis not present

## 2021-12-27 DIAGNOSIS — R531 Weakness: Secondary | ICD-10-CM | POA: Diagnosis not present

## 2021-12-27 DIAGNOSIS — M25572 Pain in left ankle and joints of left foot: Secondary | ICD-10-CM | POA: Diagnosis not present

## 2021-12-27 NOTE — Chronic Care Management (AMB) (Signed)
  Care Coordination   Note   12/27/2021 Name: Kathy Vaughan MRN: [id number] DOB: 12/04/1980  [name] is a 41 y.o. year old female who sees Gaynelle Arabian, MD for primary care. I reached out to [facility] by phone today to offer care coordination services.  [name] was given information about Care Coordination services today including:   The Care Coordination services include support from the care team which includes your Nurse Coordinator, Clinical Social Worker, or Pharmacist.  The Care Coordination team is here to help remove barriers to the health concerns and goals most important to you. Care Coordination services are voluntary, and the patient may decline or stop services at any time by request to their care team member.   Care Coordination Consent Status: Patient did not agree to participate in care coordination services at this time.    Encounter Outcome:  Pt. Refused  [place]  Direct Dial: [phone]

## 2021-12-28 DIAGNOSIS — F331 Major depressive disorder, recurrent, moderate: Secondary | ICD-10-CM | POA: Diagnosis not present

## 2021-12-28 DIAGNOSIS — F900 Attention-deficit hyperactivity disorder, predominantly inattentive type: Secondary | ICD-10-CM | POA: Diagnosis not present

## 2022-01-04 DIAGNOSIS — F900 Attention-deficit hyperactivity disorder, predominantly inattentive type: Secondary | ICD-10-CM | POA: Diagnosis not present

## 2022-01-04 DIAGNOSIS — F332 Major depressive disorder, recurrent severe without psychotic features: Secondary | ICD-10-CM | POA: Diagnosis not present

## 2022-01-04 DIAGNOSIS — Z79899 Other long term (current) drug therapy: Secondary | ICD-10-CM | POA: Diagnosis not present

## 2022-01-09 DIAGNOSIS — R262 Difficulty in walking, not elsewhere classified: Secondary | ICD-10-CM | POA: Diagnosis not present

## 2022-01-09 DIAGNOSIS — R531 Weakness: Secondary | ICD-10-CM | POA: Diagnosis not present

## 2022-01-09 DIAGNOSIS — M2022 Hallux rigidus, left foot: Secondary | ICD-10-CM | POA: Diagnosis not present

## 2022-01-09 DIAGNOSIS — M25572 Pain in left ankle and joints of left foot: Secondary | ICD-10-CM | POA: Diagnosis not present

## 2022-01-10 DIAGNOSIS — F900 Attention-deficit hyperactivity disorder, predominantly inattentive type: Secondary | ICD-10-CM | POA: Diagnosis not present

## 2022-01-10 DIAGNOSIS — F332 Major depressive disorder, recurrent severe without psychotic features: Secondary | ICD-10-CM | POA: Diagnosis not present

## 2022-01-15 ENCOUNTER — Other Ambulatory Visit: Payer: BC Managed Care – PPO

## 2022-01-21 DIAGNOSIS — M25572 Pain in left ankle and joints of left foot: Secondary | ICD-10-CM | POA: Diagnosis not present

## 2022-01-21 DIAGNOSIS — R531 Weakness: Secondary | ICD-10-CM | POA: Diagnosis not present

## 2022-01-21 DIAGNOSIS — R262 Difficulty in walking, not elsewhere classified: Secondary | ICD-10-CM | POA: Diagnosis not present

## 2022-01-21 DIAGNOSIS — M2022 Hallux rigidus, left foot: Secondary | ICD-10-CM | POA: Diagnosis not present

## 2022-01-22 DIAGNOSIS — F900 Attention-deficit hyperactivity disorder, predominantly inattentive type: Secondary | ICD-10-CM | POA: Diagnosis not present

## 2022-01-22 DIAGNOSIS — F332 Major depressive disorder, recurrent severe without psychotic features: Secondary | ICD-10-CM | POA: Diagnosis not present

## 2022-01-23 ENCOUNTER — Ambulatory Visit
Admission: RE | Admit: 2022-01-23 | Discharge: 2022-01-23 | Disposition: A | Discharge status: Home / Self Care | Payer: BC Managed Care – PPO | Source: Ambulatory Visit | Attending: Nephrology | Admitting: Nephrology

## 2022-01-23 DIAGNOSIS — N261 Atrophy of kidney (terminal): Secondary | ICD-10-CM | POA: Diagnosis not present

## 2022-01-23 DIAGNOSIS — N179 Acute kidney failure, unspecified: Secondary | ICD-10-CM

## 2022-01-23 DIAGNOSIS — N200 Calculus of kidney: Secondary | ICD-10-CM | POA: Diagnosis not present

## 2022-01-23 DIAGNOSIS — N281 Cyst of kidney, acquired: Secondary | ICD-10-CM | POA: Diagnosis not present

## 2022-02-20 DIAGNOSIS — N179 Acute kidney failure, unspecified: Secondary | ICD-10-CM | POA: Diagnosis not present

## 2022-02-20 DIAGNOSIS — N200 Calculus of kidney: Secondary | ICD-10-CM | POA: Diagnosis not present

## 2022-02-20 DIAGNOSIS — R809 Proteinuria, unspecified: Secondary | ICD-10-CM | POA: Diagnosis not present

## 2022-02-20 DIAGNOSIS — N261 Atrophy of kidney (terminal): Secondary | ICD-10-CM | POA: Diagnosis not present

## 2022-03-01 DIAGNOSIS — N261 Atrophy of kidney (terminal): Secondary | ICD-10-CM | POA: Diagnosis not present

## 2022-03-01 DIAGNOSIS — I7300 Raynaud's syndrome without gangrene: Secondary | ICD-10-CM | POA: Diagnosis not present

## 2022-03-15 DIAGNOSIS — F332 Major depressive disorder, recurrent severe without psychotic features: Secondary | ICD-10-CM | POA: Diagnosis not present

## 2022-03-15 DIAGNOSIS — F900 Attention-deficit hyperactivity disorder, predominantly inattentive type: Secondary | ICD-10-CM | POA: Diagnosis not present

## 2022-03-28 DIAGNOSIS — R531 Weakness: Secondary | ICD-10-CM | POA: Diagnosis not present

## 2022-03-28 DIAGNOSIS — M2022 Hallux rigidus, left foot: Secondary | ICD-10-CM | POA: Diagnosis not present

## 2022-03-28 DIAGNOSIS — R262 Difficulty in walking, not elsewhere classified: Secondary | ICD-10-CM | POA: Diagnosis not present

## 2022-03-28 DIAGNOSIS — M25572 Pain in left ankle and joints of left foot: Secondary | ICD-10-CM | POA: Diagnosis not present

## 2022-04-03 DIAGNOSIS — R262 Difficulty in walking, not elsewhere classified: Secondary | ICD-10-CM | POA: Diagnosis not present

## 2022-04-03 DIAGNOSIS — M25572 Pain in left ankle and joints of left foot: Secondary | ICD-10-CM | POA: Diagnosis not present

## 2022-04-03 DIAGNOSIS — R531 Weakness: Secondary | ICD-10-CM | POA: Diagnosis not present

## 2022-04-03 DIAGNOSIS — M2022 Hallux rigidus, left foot: Secondary | ICD-10-CM | POA: Diagnosis not present

## 2022-04-17 DIAGNOSIS — M2022 Hallux rigidus, left foot: Secondary | ICD-10-CM | POA: Diagnosis not present

## 2022-04-17 DIAGNOSIS — M25572 Pain in left ankle and joints of left foot: Secondary | ICD-10-CM | POA: Diagnosis not present

## 2022-04-17 DIAGNOSIS — R262 Difficulty in walking, not elsewhere classified: Secondary | ICD-10-CM | POA: Diagnosis not present

## 2022-04-17 DIAGNOSIS — R531 Weakness: Secondary | ICD-10-CM | POA: Diagnosis not present

## 2022-04-24 DIAGNOSIS — M25551 Pain in right hip: Secondary | ICD-10-CM | POA: Diagnosis not present

## 2022-05-08 DIAGNOSIS — M25572 Pain in left ankle and joints of left foot: Secondary | ICD-10-CM | POA: Diagnosis not present

## 2022-05-08 DIAGNOSIS — M2022 Hallux rigidus, left foot: Secondary | ICD-10-CM | POA: Diagnosis not present

## 2022-05-08 DIAGNOSIS — R262 Difficulty in walking, not elsewhere classified: Secondary | ICD-10-CM | POA: Diagnosis not present

## 2022-05-08 DIAGNOSIS — R531 Weakness: Secondary | ICD-10-CM | POA: Diagnosis not present

## 2022-05-23 DIAGNOSIS — M2022 Hallux rigidus, left foot: Secondary | ICD-10-CM | POA: Diagnosis not present

## 2022-05-23 DIAGNOSIS — M25572 Pain in left ankle and joints of left foot: Secondary | ICD-10-CM | POA: Diagnosis not present

## 2022-05-23 DIAGNOSIS — R262 Difficulty in walking, not elsewhere classified: Secondary | ICD-10-CM | POA: Diagnosis not present

## 2022-05-23 DIAGNOSIS — R531 Weakness: Secondary | ICD-10-CM | POA: Diagnosis not present

## 2022-05-29 DIAGNOSIS — M25551 Pain in right hip: Secondary | ICD-10-CM | POA: Diagnosis not present

## 2022-06-12 DIAGNOSIS — R531 Weakness: Secondary | ICD-10-CM | POA: Diagnosis not present

## 2022-06-12 DIAGNOSIS — R262 Difficulty in walking, not elsewhere classified: Secondary | ICD-10-CM | POA: Diagnosis not present

## 2022-06-12 DIAGNOSIS — M2022 Hallux rigidus, left foot: Secondary | ICD-10-CM | POA: Diagnosis not present

## 2022-06-12 DIAGNOSIS — M25572 Pain in left ankle and joints of left foot: Secondary | ICD-10-CM | POA: Diagnosis not present

## 2022-06-17 DIAGNOSIS — R262 Difficulty in walking, not elsewhere classified: Secondary | ICD-10-CM | POA: Diagnosis not present

## 2022-06-17 DIAGNOSIS — M2022 Hallux rigidus, left foot: Secondary | ICD-10-CM | POA: Diagnosis not present

## 2022-06-17 DIAGNOSIS — R531 Weakness: Secondary | ICD-10-CM | POA: Diagnosis not present

## 2022-06-17 DIAGNOSIS — M25572 Pain in left ankle and joints of left foot: Secondary | ICD-10-CM | POA: Diagnosis not present

## 2022-07-03 DIAGNOSIS — M2022 Hallux rigidus, left foot: Secondary | ICD-10-CM | POA: Diagnosis not present

## 2022-07-03 DIAGNOSIS — R531 Weakness: Secondary | ICD-10-CM | POA: Diagnosis not present

## 2022-07-03 DIAGNOSIS — M25572 Pain in left ankle and joints of left foot: Secondary | ICD-10-CM | POA: Diagnosis not present

## 2022-07-03 DIAGNOSIS — R262 Difficulty in walking, not elsewhere classified: Secondary | ICD-10-CM | POA: Diagnosis not present

## 2022-07-15 DIAGNOSIS — R262 Difficulty in walking, not elsewhere classified: Secondary | ICD-10-CM | POA: Diagnosis not present

## 2022-07-15 DIAGNOSIS — R531 Weakness: Secondary | ICD-10-CM | POA: Diagnosis not present

## 2022-07-15 DIAGNOSIS — M2022 Hallux rigidus, left foot: Secondary | ICD-10-CM | POA: Diagnosis not present

## 2022-07-15 DIAGNOSIS — M25572 Pain in left ankle and joints of left foot: Secondary | ICD-10-CM | POA: Diagnosis not present

## 2022-08-02 DIAGNOSIS — R531 Weakness: Secondary | ICD-10-CM | POA: Diagnosis not present

## 2022-08-02 DIAGNOSIS — M25572 Pain in left ankle and joints of left foot: Secondary | ICD-10-CM | POA: Diagnosis not present

## 2022-08-02 DIAGNOSIS — R262 Difficulty in walking, not elsewhere classified: Secondary | ICD-10-CM | POA: Diagnosis not present

## 2022-08-02 DIAGNOSIS — M2022 Hallux rigidus, left foot: Secondary | ICD-10-CM | POA: Diagnosis not present

## 2022-08-05 DIAGNOSIS — F419 Anxiety disorder, unspecified: Secondary | ICD-10-CM | POA: Diagnosis not present

## 2022-08-05 DIAGNOSIS — F3341 Major depressive disorder, recurrent, in partial remission: Secondary | ICD-10-CM | POA: Diagnosis not present

## 2022-08-05 DIAGNOSIS — F900 Attention-deficit hyperactivity disorder, predominantly inattentive type: Secondary | ICD-10-CM | POA: Diagnosis not present

## 2022-08-05 DIAGNOSIS — Z1331 Encounter for screening for depression: Secondary | ICD-10-CM | POA: Diagnosis not present

## 2022-08-13 DIAGNOSIS — R799 Abnormal finding of blood chemistry, unspecified: Secondary | ICD-10-CM | POA: Diagnosis not present

## 2022-08-13 DIAGNOSIS — N179 Acute kidney failure, unspecified: Secondary | ICD-10-CM | POA: Diagnosis not present

## 2022-08-13 DIAGNOSIS — R809 Proteinuria, unspecified: Secondary | ICD-10-CM | POA: Diagnosis not present

## 2022-08-13 DIAGNOSIS — N189 Chronic kidney disease, unspecified: Secondary | ICD-10-CM | POA: Diagnosis not present

## 2022-08-13 DIAGNOSIS — D631 Anemia in chronic kidney disease: Secondary | ICD-10-CM | POA: Diagnosis not present

## 2022-09-26 DIAGNOSIS — Z419 Encounter for procedure for purposes other than remedying health state, unspecified: Secondary | ICD-10-CM | POA: Diagnosis not present

## 2022-10-11 IMAGING — DX DG HIP (WITH OR WITHOUT PELVIS) 2-3V*R*
2 series · 2 of 2 positions shown · non-contrast
Comparison: None.

CLINICAL DATA: Pain in hips.

EXAM:
DG HIP (WITH OR WITHOUT PELVIS) 2-3V RIGHT

[dg hip unilat w or w/o pelvis 2-3 views  (1 of 2)]
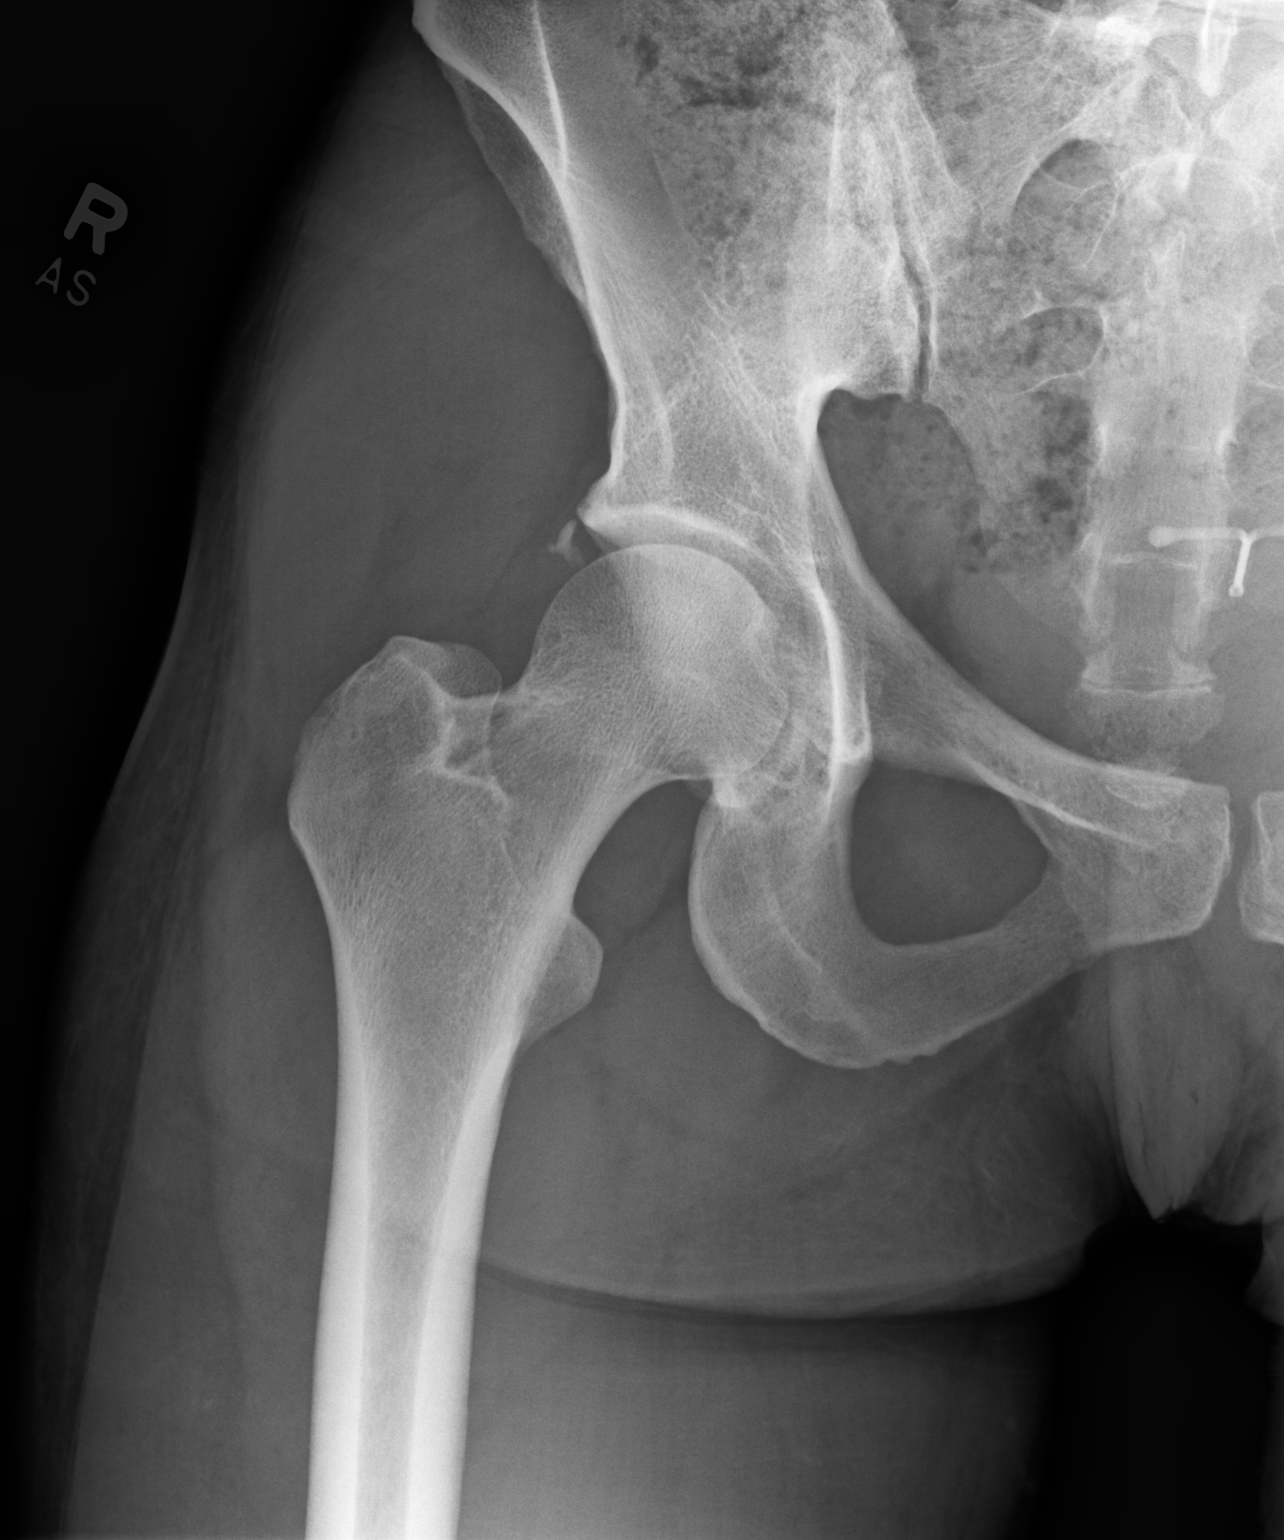

[dg hip unilat w or w/o pelvis 2-3 views  (2 of 2)]
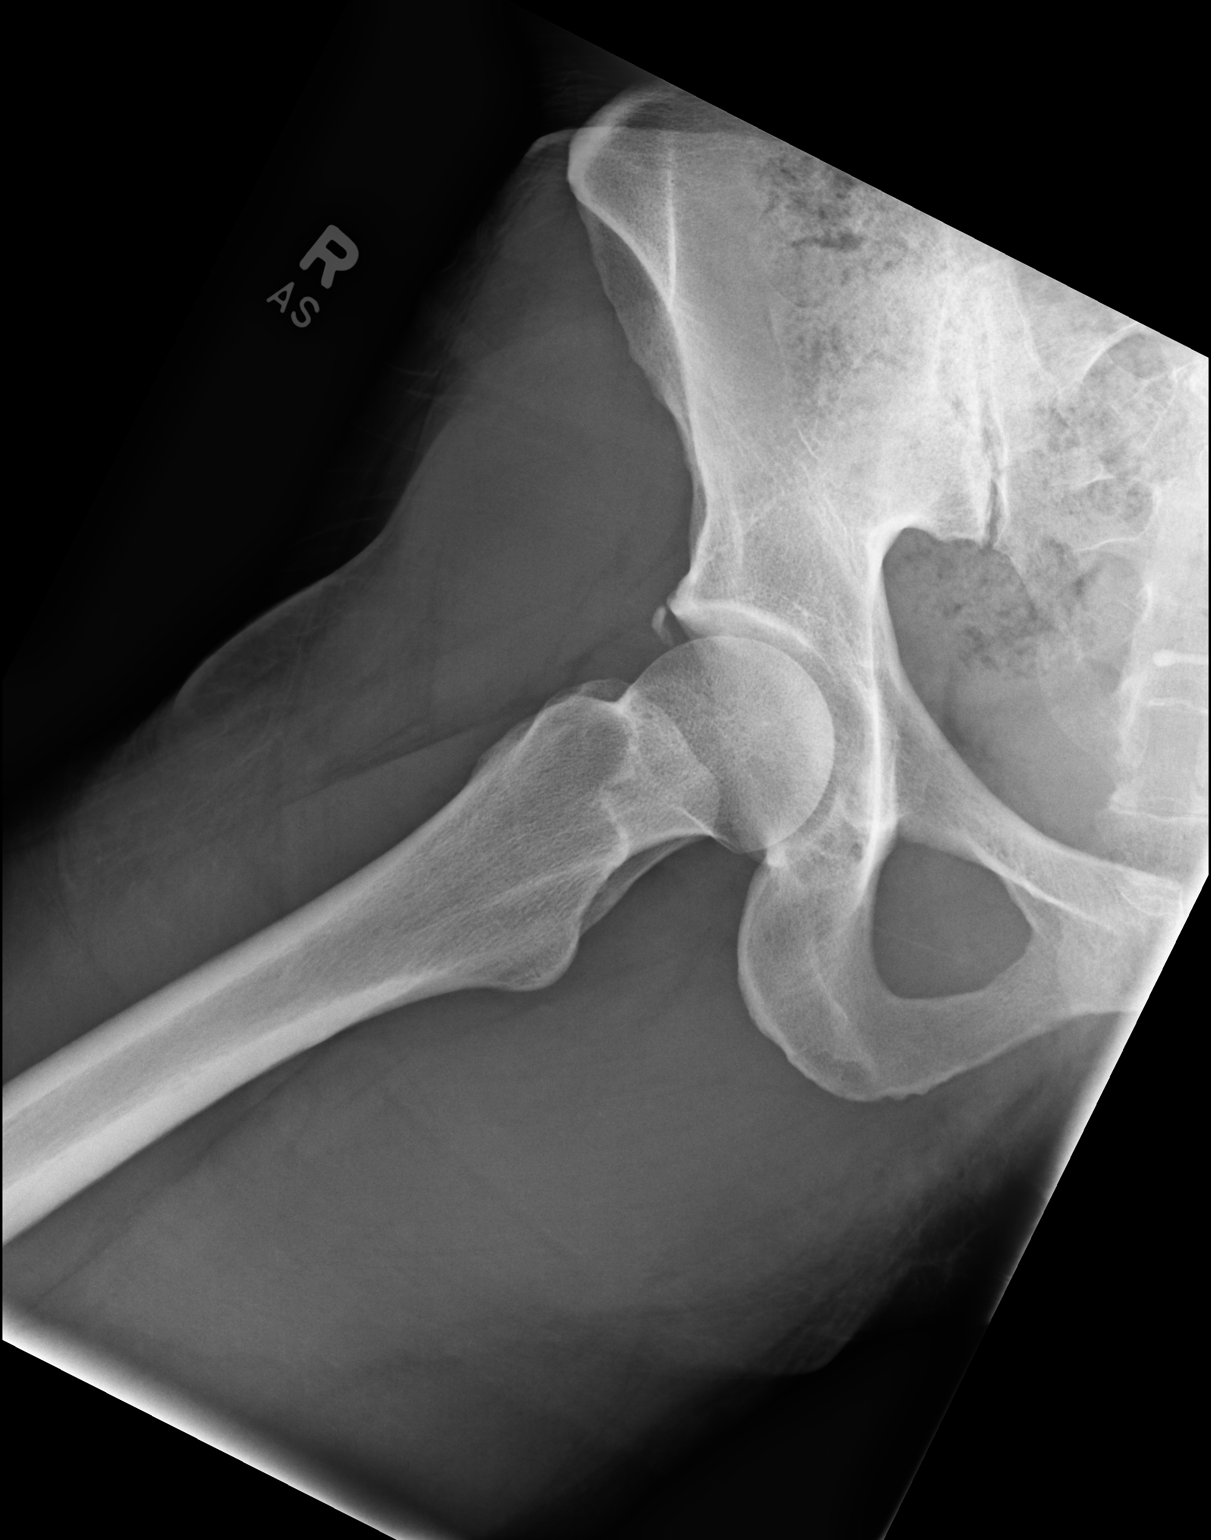

[2 of 2 positions shown; findings below may reference images not displayed]

FINDINGS: A soft tissue calcification lateral to the superior acetabulum is
likely a labral calcification. No fractures or dislocations. No bony
lesions. No loss of joint space.
IMPRESSION: Labral calcification as above.  No other abnormalities.

## 2022-10-11 IMAGING — DX DG HIP (WITH OR WITHOUT PELVIS) 2-3V*L*
2 series · 2 of 2 positions shown · non-contrast
Comparison: None.

CLINICAL DATA: Pain.

EXAM:
DG HIP (WITH OR WITHOUT PELVIS) 2-3V LEFT

[dg hip unilat w or w/o pelvis 2-3 views  (1 of 2)]
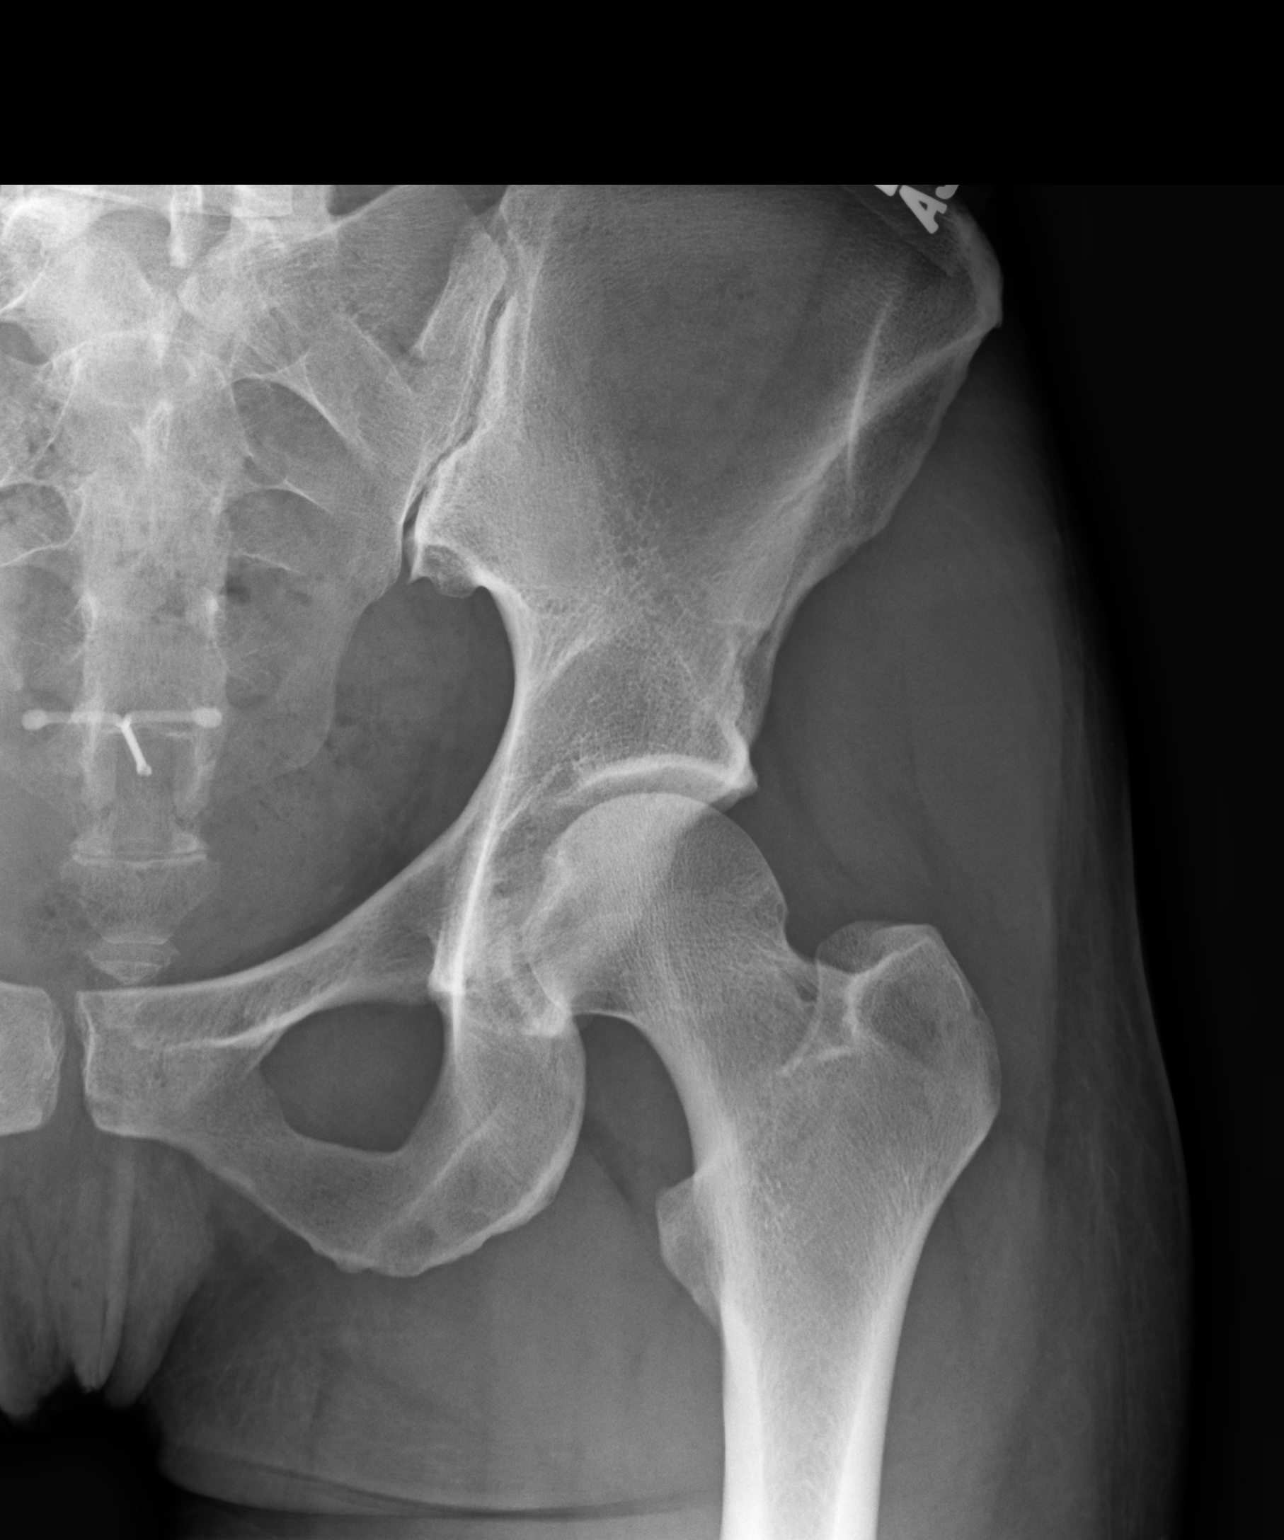

[dg hip unilat w or w/o pelvis 2-3 views  (2 of 2)]
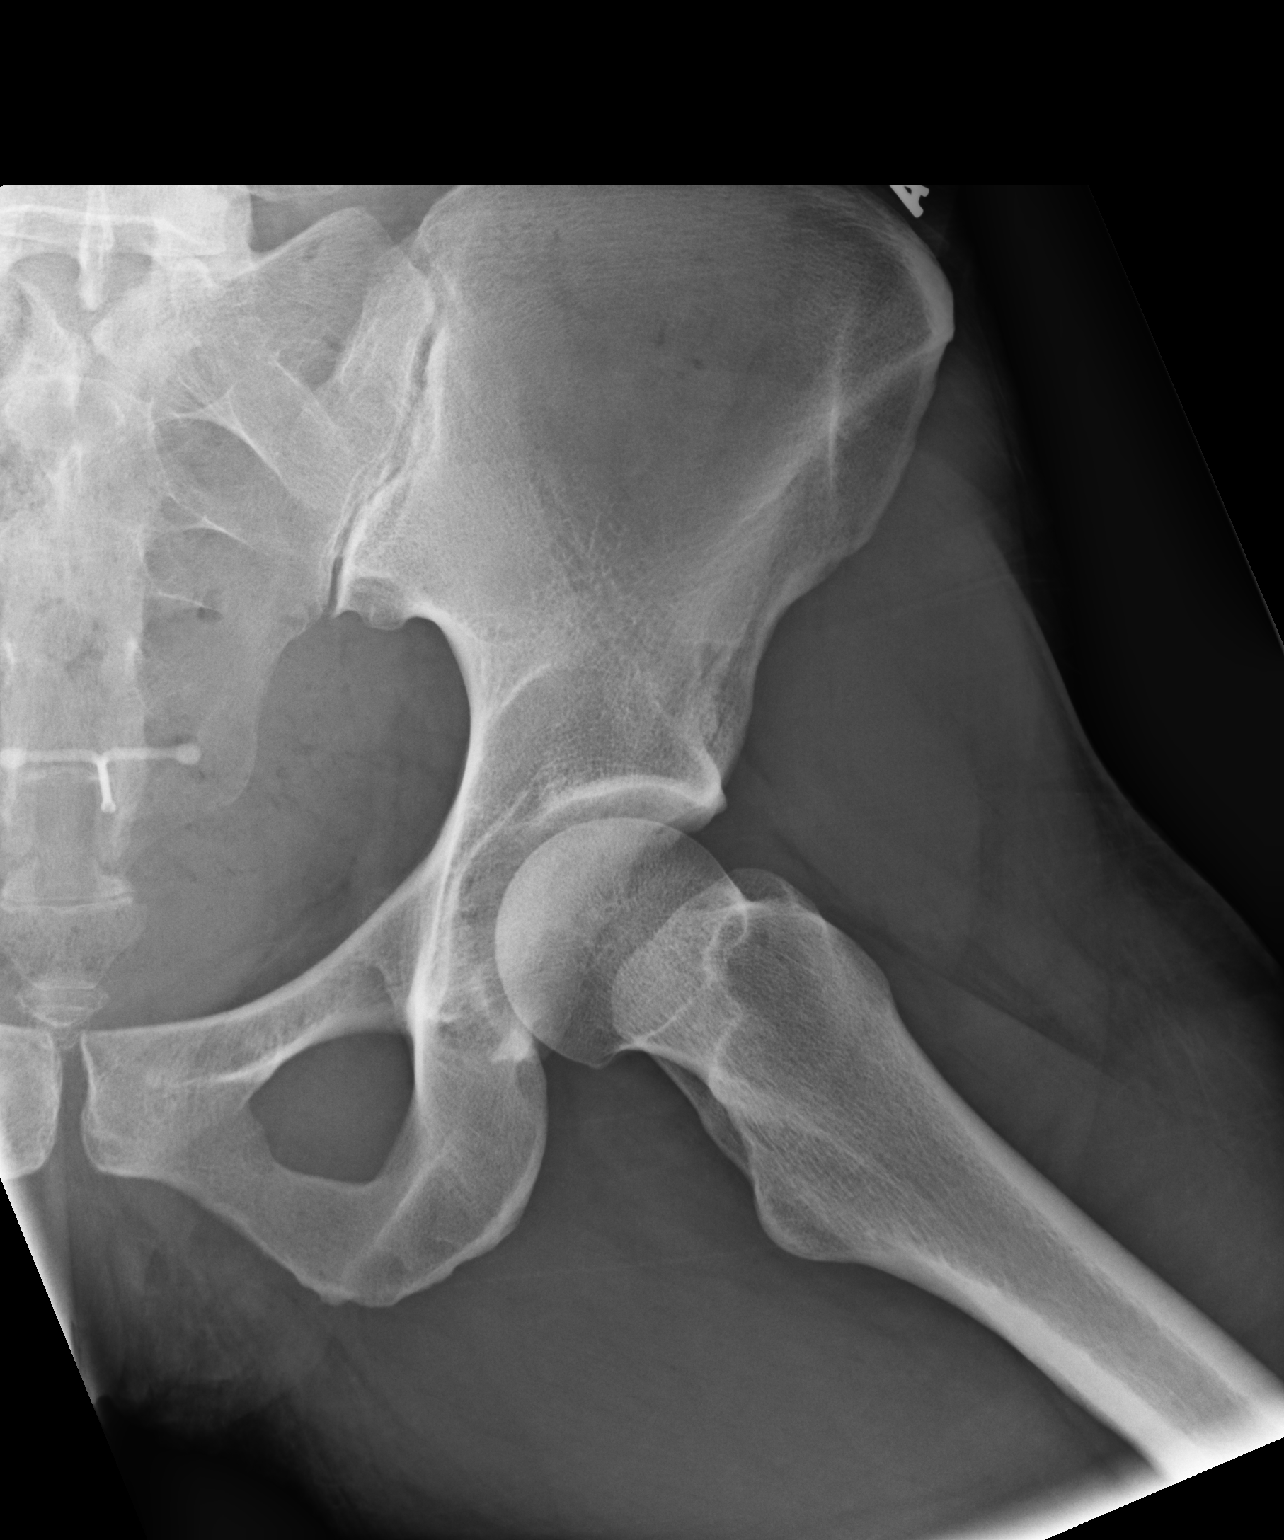

[2 of 2 positions shown; findings below may reference images not displayed]

FINDINGS: Mild degenerative changes in the left SI joint. The left hip is
normal in appearance.
IMPRESSION: Mild degenerative changes in the left SI joint. No other
abnormalities to explain the patient's left hip pain.

## 2022-10-27 DIAGNOSIS — Z419 Encounter for procedure for purposes other than remedying health state, unspecified: Secondary | ICD-10-CM | POA: Diagnosis not present

## 2022-11-01 IMAGING — XA DG FLUORO GUIDE NDL PLC/BX
3 series · 3 of 3 positions shown · non-contrast
Comparison: none

CLINICAL DATA: Recurrent pain.  No previous surgery.

EXAM:
LEFT HIP INJECTION UNDER FLUOROSCOPY FOR MRI
FLUOROSCOPY TIME:  19 seconds; 12 u6ymE DAP
TECHNIQUE: An appropriate skin entry site was determined under fluoroscopy.
Skin site was marked, prepped with Betadine, and draped in usual
sterile fashion, and infiltrated locally with 1% lidocaine.

[Series 1: ortho adipose · 1 of 1 slices shown (1 of 3)]
[im 1/1]
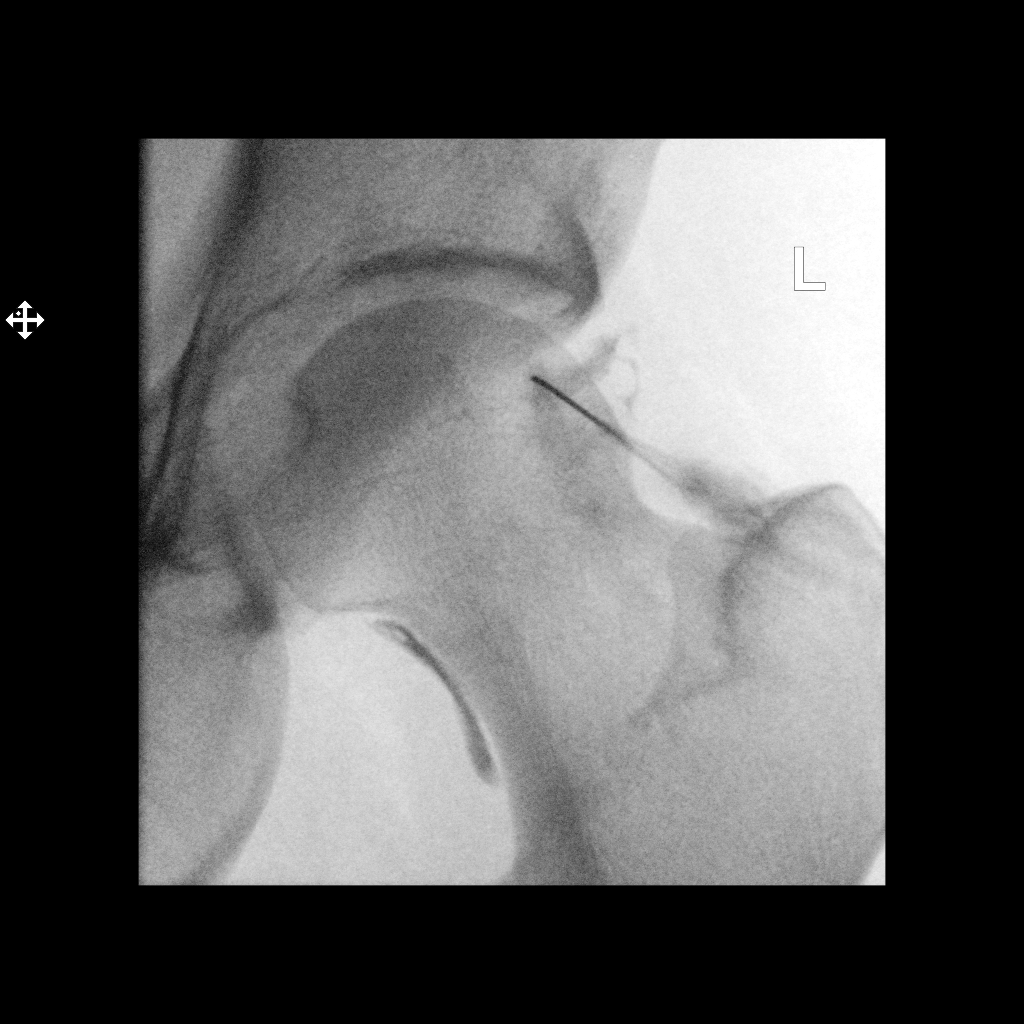

[Series 2: ortho adipose · 1 of 1 slices shown (2 of 3)]
[im 1/1]
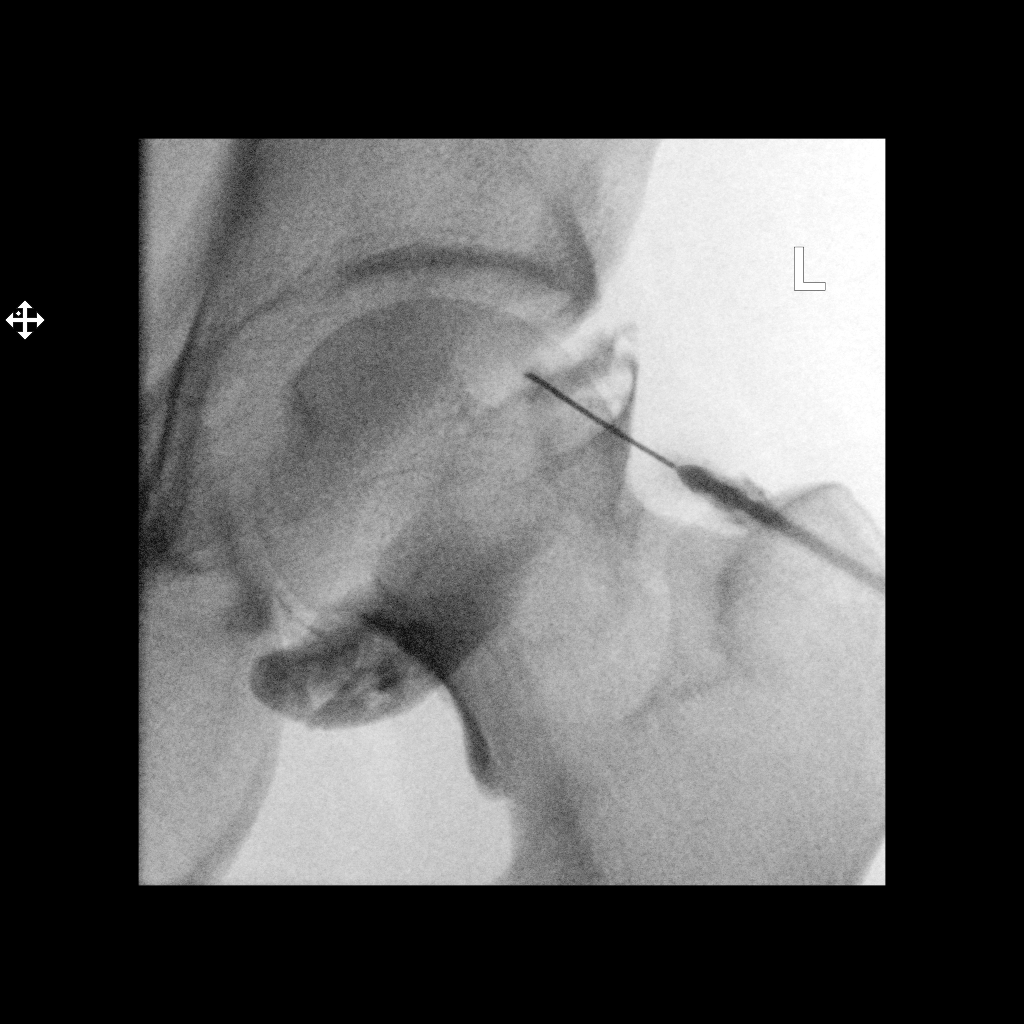

[Series 3: ortho adipose · 1 of 1 slices shown (3 of 3)]
[im 1/1]
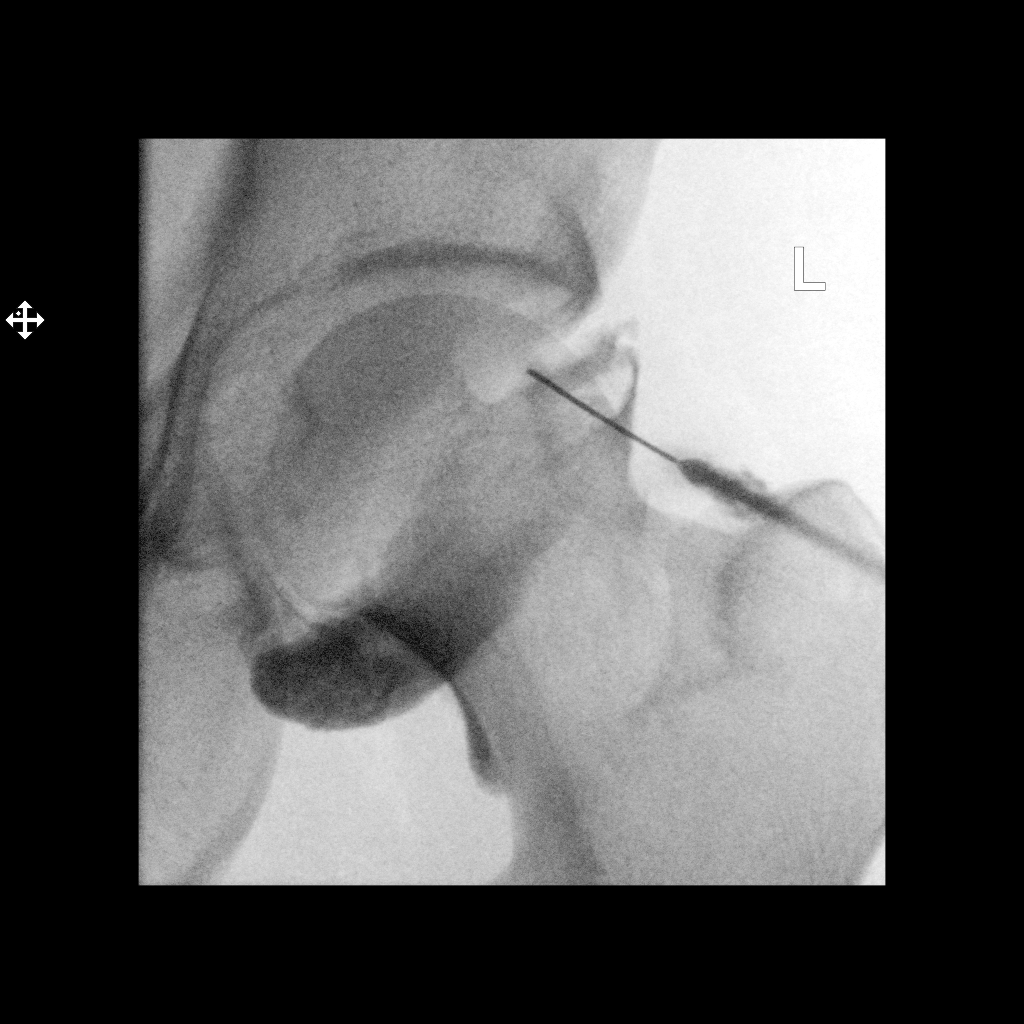

[3 of 3 positions shown; findings below may reference images not displayed]

22-gauge spinal needle advanced to the superior lateral margin of
the femoral head. 1 mL of lidocaine 1% injected easily. 10ml of a
mixture of 20 mL dilute iodinated contrast with 0.1ml Multihance
contrast was injected into the joint. Intraarticular flow was
confirmed on fluoroscopy. Patient transferred to MRI.
IMPRESSION: 1. Technically successful left hip injection for MRI

## 2022-11-04 DIAGNOSIS — F900 Attention-deficit hyperactivity disorder, predominantly inattentive type: Secondary | ICD-10-CM | POA: Diagnosis not present

## 2022-11-04 DIAGNOSIS — F3341 Major depressive disorder, recurrent, in partial remission: Secondary | ICD-10-CM | POA: Diagnosis not present

## 2022-11-04 DIAGNOSIS — F419 Anxiety disorder, unspecified: Secondary | ICD-10-CM | POA: Diagnosis not present

## 2022-11-26 DIAGNOSIS — Z419 Encounter for procedure for purposes other than remedying health state, unspecified: Secondary | ICD-10-CM | POA: Diagnosis not present

## 2022-12-27 DIAGNOSIS — Z419 Encounter for procedure for purposes other than remedying health state, unspecified: Secondary | ICD-10-CM | POA: Diagnosis not present

## 2023-01-26 DIAGNOSIS — Z419 Encounter for procedure for purposes other than remedying health state, unspecified: Secondary | ICD-10-CM | POA: Diagnosis not present

## 2023-02-03 DIAGNOSIS — F900 Attention-deficit hyperactivity disorder, predominantly inattentive type: Secondary | ICD-10-CM | POA: Diagnosis not present

## 2023-02-03 DIAGNOSIS — F419 Anxiety disorder, unspecified: Secondary | ICD-10-CM | POA: Diagnosis not present

## 2023-02-03 DIAGNOSIS — F3341 Major depressive disorder, recurrent, in partial remission: Secondary | ICD-10-CM | POA: Diagnosis not present

## 2023-02-26 DIAGNOSIS — Z419 Encounter for procedure for purposes other than remedying health state, unspecified: Secondary | ICD-10-CM | POA: Diagnosis not present

## 2023-03-10 DIAGNOSIS — F900 Attention-deficit hyperactivity disorder, predominantly inattentive type: Secondary | ICD-10-CM | POA: Diagnosis not present

## 2023-03-10 DIAGNOSIS — F3341 Major depressive disorder, recurrent, in partial remission: Secondary | ICD-10-CM | POA: Diagnosis not present

## 2023-03-10 DIAGNOSIS — F419 Anxiety disorder, unspecified: Secondary | ICD-10-CM | POA: Diagnosis not present

## 2023-03-26 ENCOUNTER — Other Ambulatory Visit: Payer: Self-pay

## 2023-03-26 ENCOUNTER — Encounter: Payer: Self-pay | Admitting: Family Medicine

## 2023-03-26 ENCOUNTER — Ambulatory Visit: Payer: BC Managed Care – PPO | Admitting: Family Medicine

## 2023-03-26 VITALS — BP 122/78 | Ht 65.0 in | Wt 135.0 lb

## 2023-03-26 DIAGNOSIS — M84375A Stress fracture, left foot, initial encounter for fracture: Secondary | ICD-10-CM | POA: Insufficient documentation

## 2023-03-26 DIAGNOSIS — M79672 Pain in left foot: Secondary | ICD-10-CM

## 2023-03-26 NOTE — Patient Instructions (Signed)
You have a stress fracture of the fifth metatarsal Make sure to wear the cam walker boot for any activity that you are weightbearing You can modify your workouts to do 1 leg and leg press if this does not cause weightbearing on the foot Cycling in clips is ok but do not stand up in the saddle.  Avoid running, jumping, applying force through the foot Continue taking vitamin D and calcium at 800mg  and 1200mg  daily.  We will follow up in 2 weeks for reassessment.

## 2023-03-26 NOTE — Assessment & Plan Note (Signed)
-   Ultrasound findings are consistent with stress fracture of the proximal fifth metatarsal. - She has been walking around on this for quite some time and it seems stable, however we will offload it for optimal healing as she is very motivated and highly active. - CAM Walker boot placed today.  Will keep her in this for the next 2 weeks and follow-up for reevaluation. - At that point may be able to transition to postop shoe. 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 - We also advised her starting calcium and vitamin D supplements for the next 3 months.

## 2023-03-26 NOTE — Progress Notes (Signed)
Kathy Vaughan - 43 y.o. female MRN [id number]  Date of birth: 04-21-1980  PCP: Blair Heys, MD (Inactive)  Subjective:  No chief complaint on file.  Left 5th MT pain  HPI: Past Medical, Surgical, Social, and Family History Reviewed & Updated per EMR.   Patient is a 43 y.o. female here for pain in the left 5th distal MT that has been present for several months. She notes having a "stiff big toe" and puts more of her weight on the outside of her foot. She is a runner and runs 13-14 miles a week on Sundays only. She has tried nothing to help. She is able to bear weight but has ongoing pain after running and tries to stay off of it.  She has not had any swelling or bruising, numbness or weakness.  Pertinent PMHx: pes planus, bunionectomy  Past Surgical History:  Procedure Laterality Date   BUNIONECTOMY Left 02/08/2021   Procedure: Left Lapidus and Modified Orpha Bur;  Surgeon: Toni Arthurs, MD;  Location: [facility];  Service: Orthopedics;  Laterality: Left;   HAMMERTOE RECONSTRUCTION WITH WEIL OSTEOTOMY Left 02/08/2021   Procedure: Second Gerhard Munch Osteotomy and Hammertoe Correction with Flexor Tenotomy;  Surgeon: Toni Arthurs, MD;  Location: [facility];  Service: Orthopedics;  Laterality: Left;   WISDOM TOOTH EXTRACTION      No Known Allergies      Objective:  Physical Exam: VS: BP:122/78  HR: bpm  TEMP: ( )  RESP:   HT:5\' 5"  (165.1 cm)   WT:135 lb (61.2 kg)  BMI:22.47  Gen: Well developed, NAD, speaks clearly, comfortable in exam room Respiratory: Normal work of breathing on room air, no respiratory distress Skin: No rashes, abrasions, or ecchymosis MSK:  Left Foot:  Observation: With weightbearing there is increased loading on the fifth metatarsal and loss of longitudinal arch with a well-healed manual surgical scar between the 1st-2nd MTP.  Transverse arch is maintained but the second MTP has breakdown of the volar plate with  extension at the joint and flexion at the PIP.  There is also pronation at the ankle. The right foot shows some valgus deviation of the first MTP with preserved longitudinal arch and mild-moderate breakdown of the transverse arch. ROM of passive IR and ER of forefoot: Normal and nonpainful ROM Phalanges: Full of 2-5, there is limited extension to 10 degrees of the first MTP Callus pattern: Marked formation over the medial aspect of the great toe Talar dome nontender, Navicular NT, Cuboid: NT Calcaneous: NT Plantar Fascia: NT Achilles: NT without nodularity Peroneals: NT Post Tib: NT Metatarsals: Bilateral first brachymetatarsia present 5th MT: Mild discomfort with palpation to the proximal/medial aspect Dorsalis Pedis pulse: 2+, and equal bilat Sensation: intact   Ultrasound of the left foot:  - The is visualized in LAX and SAX and the tendinous insertion has no hypoechoic changes or free floating calcifications indicative of avulsion.  -There are cortical defects on the dorsal and plantar aspects of the proximal fifth metatarsal of the left foot with overlying soft tissue edema and cortical thickening, particularly on the dorsal aspect.  This is consistent with a stress fracture  Summary: Stress fracture of the proximal left fifth metatarsal  Ultrasound and interpretation by Dr. Webb Silversmith and Dr. Christella Hartigan    Assessment & Plan:   Stress fracture of metatarsal bone of left foot - Ultrasound findings are consistent with stress fracture of the proximal fifth metatarsal. - She has been walking around on this for quite  some time and it seems stable, however we will offload it for optimal healing as she is very motivated and highly active. - CAM Walker boot placed today.  Will keep her in this for the next 2 weeks and follow-up for reevaluation. - At that point may be able to transition to postop shoe. 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 - We also advised her starting calcium and vitamin D supplements for the next 3 months.    Rica Mote MD [facility] Sports Medicine Fellow

## 2023-03-29 DIAGNOSIS — Z419 Encounter for procedure for purposes other than remedying health state, unspecified: Secondary | ICD-10-CM | POA: Diagnosis not present

## 2023-04-09 ENCOUNTER — Other Ambulatory Visit: Payer: Self-pay

## 2023-04-09 ENCOUNTER — Ambulatory Visit (INDEPENDENT_AMBULATORY_CARE_PROVIDER_SITE_OTHER): Payer: BC Managed Care – PPO | Admitting: Family Medicine

## 2023-04-09 VITALS — BP 124/84 | Ht 65.0 in | Wt 135.0 lb

## 2023-04-09 DIAGNOSIS — M79672 Pain in left foot: Secondary | ICD-10-CM | POA: Diagnosis not present

## 2023-04-09 DIAGNOSIS — M84375D Stress fracture, left foot, subsequent encounter for fracture with routine healing: Secondary | ICD-10-CM

## 2023-04-09 NOTE — Assessment & Plan Note (Addendum)
Pain improved, has been wearing CAM boot for 2 weeks. Started vitamin D and calcium supplement. Exam unremarkable today. [place] with increasing cortical thickening on dorsal aspect of proximal 5th metarsal c/w healing stress fracture. Offered transition to post op shoe today, she would like to continue wearing CAM walker given level of activity. - Continue CAM walker for 2 weeks - x-ray 1 day prior to 2 week follow up - Continue vitamin D and calcium supplements

## 2023-04-09 NOTE — Progress Notes (Unsigned)
PCP: Blair Heys, MD (Inactive)  SUBJECTIVE:   HPI:  Patient is a 43 y.o. female here for follow up of left 5th metatarsal stress fracture. She is wearing CAM walker at all times. Continuing exercise classes while wearing the boot. Denies pain with ambulation or during workouts.. Taking vitamin D and calcium supplements.  Pertinent ROS were reviewed with the patient and found to be negative unless otherwise specified above in HPI.   PERTINENT  PMH / PSH / FH / SH:  Past Medical, Surgical, Social, and Family History Reviewed & Updated in the EMR.  Pertinent findings include:    Past Medical History:  Diagnosis Date   ADHD    Depressed     Current Outpatient Medications on File Prior to Visit  Medication Sig Dispense Refill   aspirin EC 81 MG tablet Take 1 tablet (81 mg total) by mouth 2 (two) times daily. 84 tablet 0   Brexpiprazole (REXULTI PO) Take 2 mg by mouth.     docusate sodium (COLACE) 100 MG capsule Take 1 capsule (100 mg total) by mouth 2 (two) times daily. While taking narcotic pain medicine. 30 capsule 0   DULoxetine HCl (CYMBALTA PO) Take 120 mg by mouth.     ondansetron (ZOFRAN ODT) 4 MG disintegrating tablet Take 1 tablet (4 mg total) by mouth every 8 (eight) hours as needed for nausea. 10 tablet 0   senna (SENOKOT) 8.6 MG TABS tablet Take 2 tablets (17.2 mg total) by mouth 2 (two) times daily. 30 tablet 0   tamsulosin (FLOMAX) 0.4 MG CAPS capsule Take 1 capsule (0.4 mg total) by mouth daily after supper. 10 capsule 0   VYVANSE 60 MG capsule Take 60 mg by mouth daily as needed.     WELLBUTRIN XL 150 MG 24 hr tablet Take 150 mg by mouth daily.     No current facility-administered medications on file prior to visit.    Past Surgical History:  Procedure Laterality Date   BUNIONECTOMY Left 02/08/2021   Procedure: Left Lapidus and Modified Orpha Bur;  Surgeon: Toni Arthurs, MD;  Location: [facility];  Service: Orthopedics;  Laterality:  Left;   HAMMERTOE RECONSTRUCTION WITH WEIL OSTEOTOMY Left 02/08/2021   Procedure: Second Gerhard Munch Osteotomy and Hammertoe Correction with Flexor Tenotomy;  Surgeon: Toni Arthurs, MD;  Location: [facility];  Service: Orthopedics;  Laterality: Left;   WISDOM TOOTH EXTRACTION      No Known Allergies  OBJECTIVE:  BP 124/84   Ht 5\' 5"  (1.651 m)   Wt 135 lb (61.2 kg)   BMI 22.47 kg/m   PHYSICAL EXAM:  GEN: Alert and Oriented, NAD, comfortable in exam room RESP: Unlabored respirations, symmetric chest rise PSY: normal mood, congruent affect   MSK EXAM: Left Foot:  Observation: With weightbearing there is increased loading on the fifth metatarsal and loss of longitudinal arch with a well-healed manual surgical scar between the 1st-2nd MTP.  Transverse arch is maintained but the second MTP has breakdown of the volar plate with extension at the joint and flexion at the PIP.  There is also pronation at the ankle.  ROM of passive IR and ER of forefoot: Normal and nonpainful ROM Phalanges: Full of 2-5, there is limited extension to 10 degrees of the first MTP 5th MT: No discomfort with palpation throughout the 5th metatarsal  Dorsalis Pedis pulse: 2+, and equal bilat Sensation: intact Strength: 5/5 in all directions of ankle, some discomfort over 5th metatarsal with resisted plantar  flexion     Ultrasound of the left foot:  - The is visualized in LAX and SAX and the tendinous insertion has no hypoechoic changes or free floating calcifications indicative of avulsion.   -There is continuing cortical thickening, particularly on the dorsal aspect of the 5th metatarsal c/w healing stress fracture   [place] reviewed with Dr. Christella Hartigan Assessment & Plan Left foot pain  Stress fracture of metatarsal bone of left foot with routine healing, subsequent encounter Pain improved, has been wearing CAM boot for 2 weeks. Started vitamin D and calcium supplement. Exam unremarkable today. [place] with  increasing cortical thickening on dorsal aspect of proximal 5th metarsal c/w healing stress fracture. Offered transition to post op shoe today, she would like to continue wearing CAM walker given level of activity. - Continue CAM walker for 2 weeks - x-ray 1 day prior to 2 week follow up - Continue vitamin D and calcium supplements   Micah Noel MD PGY2 [facility] Medicine  Fellow attestation  I was present for the exam, ultrasound, and agree with the above assessment and plan from the resident.  Kathy Vaughan has been consistent with wearing the boot and modifying her activities to offload the fifth metatarsal.  Pain has been well-managed.  On exam there is no acute abnormality with inspection No tenderness to palpation over the proximal fifth metatarsal.  Ultrasound findings show progression of the dorsal and plantar aspect of healing with cortical thickening on the dorsal aspect and thickened callus formation on the plantar aspect.  Images were not saved  Agree with above plan  Rica Mote MD [facility] Sports Medicine Fellow

## 2023-04-21 ENCOUNTER — Ambulatory Visit
Admission: RE | Admit: 2023-04-21 | Discharge: 2023-04-21 | Disposition: A | Discharge status: Home / Self Care | Payer: BC Managed Care – PPO | Source: Ambulatory Visit | Attending: Family Medicine | Admitting: Family Medicine

## 2023-04-21 ENCOUNTER — Other Ambulatory Visit: Payer: Self-pay

## 2023-04-21 DIAGNOSIS — M84375D Stress fracture, left foot, subsequent encounter for fracture with routine healing: Secondary | ICD-10-CM

## 2023-04-21 DIAGNOSIS — M79672 Pain in left foot: Secondary | ICD-10-CM

## 2023-04-21 DIAGNOSIS — Z4789 Encounter for other orthopedic aftercare: Secondary | ICD-10-CM | POA: Diagnosis not present

## 2023-04-23 DIAGNOSIS — F419 Anxiety disorder, unspecified: Secondary | ICD-10-CM | POA: Diagnosis not present

## 2023-04-23 DIAGNOSIS — F3341 Major depressive disorder, recurrent, in partial remission: Secondary | ICD-10-CM | POA: Diagnosis not present

## 2023-04-23 DIAGNOSIS — F900 Attention-deficit hyperactivity disorder, predominantly inattentive type: Secondary | ICD-10-CM | POA: Diagnosis not present

## 2023-04-24 ENCOUNTER — Ambulatory Visit (INDEPENDENT_AMBULATORY_CARE_PROVIDER_SITE_OTHER): Payer: BC Managed Care – PPO | Admitting: Sports Medicine

## 2023-04-24 ENCOUNTER — Other Ambulatory Visit: Payer: Self-pay

## 2023-04-24 VITALS — BP 126/83 | Ht 65.0 in | Wt 135.0 lb

## 2023-04-24 DIAGNOSIS — M84375D Stress fracture, left foot, subsequent encounter for fracture with routine healing: Secondary | ICD-10-CM

## 2023-04-24 DIAGNOSIS — M79672 Pain in left foot: Secondary | ICD-10-CM | POA: Diagnosis not present

## 2023-04-24 NOTE — Patient Instructions (Signed)
 The fracture in the left 5th metatarsal shoes great healing on ultrasound.  Plan now is to transition to the post-op shoe for an additional 2 weeks. Can continue low-impact activity, particularly gradual increase in ankle range of motion. Let's follow-up in 2 weeks and if doing well at that point will likely return to gradual increase in running.

## 2023-04-24 NOTE — Assessment & Plan Note (Signed)
 See below

## 2023-04-24 NOTE — Progress Notes (Addendum)
   PCP: Blair Heys, MD (Inactive)  SUBJECTIVE:   HPI:  Patient is a 43 y.o. female here for follow-up of left 5th metatarsal stress fracture, initially noted on U/S 03/26/23 w/ Dr. Webb Silversmith.  She has been compliant with immobilization in CAM boot for the past 4 weeks. Denies any pain currently. Pain was really only present during her once weekly, Sunday 10-13 mile runs previously, though she does also do much personal training, HIIT, box jumps, etc. She has continued her workouts with modifications to accommodate her boot. No other complaints.   Pertinent ROS were reviewed with the patient and found to be negative unless otherwise specified above in HPI.   PERTINENT  PMH / PSH / FH / SH:  Past Medical, Surgical, Social, and Family History Reviewed & Updated in the EMR.  Pertinent findings include:  Bunionectomy of left foot and left 2nd hammertoe reconstruction 01/2021  No Known Allergies  OBJECTIVE:  BP 126/83   Ht 5\' 5"  (1.651 m)   Wt 135 lb (61.2 kg)   BMI 22.47 kg/m   PHYSICAL EXAM:  GEN: Alert and Oriented, NAD, comfortable in exam room RESP: Unlabored respirations, symmetric chest rise PSY: normal mood, congruent affect   MSK EXAM: LEFT FOOT/ANKLE No deformity, swelling, or bruising.  FROM and strength 5/5. No TTP across TMT joints or at proximal 5th MT with palpation.  Limited MSK Ultrasound of the Left Foot 5th Metatarsal visualized in both LAX and SAX from proximal to distal with well healed cortical thickening proximally without surrounding periosteal elevation, cortical defects, or increased doppler flow.   Impression: Near complete interval healing of the proximal left 5th metatarsal stress fracture. U/S performed and interpreted by Glean Salen, MD. Assessment & Plan Left foot pain See below. Stress fracture of metatarsal bone of left foot with routine healing, subsequent encounter Continued appropriate healing of proximal left 5th MT stress fracture as  seen on ultrasound today. X-rays from 04/21/23 reviewed which show no current concern for cortical irregularity/fracture of the proximal 5th MT, though does show chronic changes at her 1st MTP joint and TMT joint related to her previous bunionectomy.  Plan: -Transitioned out of CAM boot to post-op shoe today for additional 2 weeks to allow for full 6 weeks of immobilization. -Continued activity modification with post-op shoe. Reviewed ankle ROM exercises and strengthening. -Continue calcium and vit d supplementation. -F/u in 2 weeks with plans to transition out of post-op shoe back to sneakers and gradual return back to activity as pain tolerates.   Glean Salen, MD PGY-4, Sports Medicine Fellow [facility]  I observed and examined the patient with the [facility] resident and agree with assessment and plan.  Note reviewed and modified by me. Sterling Big, MD

## 2023-04-24 NOTE — Assessment & Plan Note (Signed)
 Continued appropriate healing of proximal left 5th MT stress fracture as seen on ultrasound today. X-rays from 04/21/23 reviewed which show no current concern for cortical irregularity/fracture of the proximal 5th MT, though does show chronic changes at her 1st MTP joint and TMT joint related to her previous bunionectomy.  Plan: -Transitioned out of CAM boot to post-op shoe today for additional 2 weeks to allow for full 6 weeks of immobilization. -Continued activity modification with post-op shoe. Reviewed ankle ROM exercises and strengthening. -Continue calcium and vit d supplementation. -F/u in 2 weeks with plans to transition out of post-op shoe back to sneakers and gradual return back to activity as pain tolerates.

## 2023-04-26 DIAGNOSIS — Z419 Encounter for procedure for purposes other than remedying health state, unspecified: Secondary | ICD-10-CM | POA: Diagnosis not present

## 2023-05-07 ENCOUNTER — Ambulatory Visit (INDEPENDENT_AMBULATORY_CARE_PROVIDER_SITE_OTHER): Admitting: Family Medicine

## 2023-05-07 VITALS — BP 110/80 | Ht 65.0 in | Wt 135.0 lb

## 2023-05-07 DIAGNOSIS — M84375D Stress fracture, left foot, subsequent encounter for fracture with routine healing: Secondary | ICD-10-CM | POA: Diagnosis not present

## 2023-05-07 NOTE — Assessment & Plan Note (Signed)
-   Kathy Vaughan's sxs have resolved. She has no pain with weight bearing, one legged stance, or heal raise - We will transition her out of the post op shoe back into regular tennis shoes - I stressed the need to slowly increase her activity over time and not going to quickly back into her previous level of training to avoid re injury.  - She will set up and appointment to return for custom orthotics which I believe will help her.

## 2023-05-07 NOTE — Progress Notes (Unsigned)
 Adelle Zachar - 43 y.o. female MRN [id number]  Date of birth: 16-Jun-1980  PCP: Blair Heys, MD (Inactive)  Subjective:  No chief complaint on file.  Stress fracture of foot  HPI: Past Medical, Surgical, Social, and Family History Reviewed & Updated per EMR.   Patient is a 43 y.o. female here for follow up on her 5th MT stress fracture, last seen on 04/24/2023. The patient has been wearing the post op shoe all the time which has helped to maintain her sxs and ambulate with modified exercise. She has no pain when bearing weight in the shower and has no new injuries, pain, swelling, numbness, or tingling.    Past Medical History:  Diagnosis Date   ADHD    Depressed     Current Outpatient Medications on File Prior to Visit  Medication Sig Dispense Refill   aspirin EC 81 MG tablet Take 1 tablet (81 mg total) by mouth 2 (two) times daily. 84 tablet 0   Brexpiprazole (REXULTI PO) Take 2 mg by mouth.     docusate sodium (COLACE) 100 MG capsule Take 1 capsule (100 mg total) by mouth 2 (two) times daily. While taking narcotic pain medicine. 30 capsule 0   DULoxetine HCl (CYMBALTA PO) Take 120 mg by mouth.     ondansetron (ZOFRAN ODT) 4 MG disintegrating tablet Take 1 tablet (4 mg total) by mouth every 8 (eight) hours as needed for nausea. 10 tablet 0   senna (SENOKOT) 8.6 MG TABS tablet Take 2 tablets (17.2 mg total) by mouth 2 (two) times daily. 30 tablet 0   tamsulosin (FLOMAX) 0.4 MG CAPS capsule Take 1 capsule (0.4 mg total) by mouth daily after supper. 10 capsule 0   VYVANSE 60 MG capsule Take 60 mg by mouth daily as needed.     WELLBUTRIN XL 150 MG 24 hr tablet Take 150 mg by mouth daily.     No current facility-administered medications on file prior to visit.    Past Surgical History:  Procedure Laterality Date   BUNIONECTOMY Left 02/08/2021   Procedure: Left Lapidus and Modified Orpha Bur;  Surgeon: Toni Arthurs, MD;  Location: [facility];  Service:  Orthopedics;  Laterality: Left;   HAMMERTOE RECONSTRUCTION WITH WEIL OSTEOTOMY Left 02/08/2021   Procedure: Second Gerhard Munch Osteotomy and Hammertoe Correction with Flexor Tenotomy;  Surgeon: Toni Arthurs, MD;  Location: [facility];  Service: Orthopedics;  Laterality: Left;   WISDOM TOOTH EXTRACTION      No Known Allergies      Objective:  Physical Exam: VS: BP:110/80  HR: bpm  TEMP: ( )  RESP:   HT:5\' 5"  (165.1 cm)   WT:135 lb (61.2 kg)  BMI:22.47  Gen: NAD, speaks clearly, comfortable in exam room Respiratory: Normal respiratory effort on room air. No signs of distress Skin: No rashes, abrasions, or ecchymosis MSK:  Inspection of the left foot reveals no edema or erythema The ankle  and toes have full ROM No TTP over the cuneiforms, cuboid, or 5th MT Peroneal tendons NTTP Pt bears weight without difficulty One legged stance tolerable w/out pain but some instability apparent  Heal lift normal    Assessment & Plan:   Stress fracture of metatarsal bone of left foot - Lillyonna's sxs have resolved. She has no pain with weight bearing, one legged stance, or heal raise - We will transition her out of the post op shoe back into regular tennis shoes - I stressed the need to slowly increase her  activity over time and not going to quickly back into her previous level of training to avoid re injury.  - She will set up and appointment to return for custom orthotics which I believe will help her.     Rica Mote MD [facility] Sports Medicine Fellow

## 2023-05-14 DIAGNOSIS — M25551 Pain in right hip: Secondary | ICD-10-CM | POA: Diagnosis not present

## 2023-05-14 DIAGNOSIS — S92355S Nondisplaced fracture of fifth metatarsal bone, left foot, sequela: Secondary | ICD-10-CM | POA: Diagnosis not present

## 2023-05-14 DIAGNOSIS — M25552 Pain in left hip: Secondary | ICD-10-CM | POA: Diagnosis not present

## 2023-05-14 DIAGNOSIS — M4306 Spondylolysis, lumbar region: Secondary | ICD-10-CM | POA: Diagnosis not present

## 2023-05-28 ENCOUNTER — Ambulatory Visit (INDEPENDENT_AMBULATORY_CARE_PROVIDER_SITE_OTHER): Admitting: Family Medicine

## 2023-05-28 VITALS — BP 116/72 | Ht 65.0 in | Wt 135.0 lb

## 2023-05-28 DIAGNOSIS — M25579 Pain in unspecified ankle and joints of unspecified foot: Secondary | ICD-10-CM

## 2023-05-28 DIAGNOSIS — M25572 Pain in left ankle and joints of left foot: Secondary | ICD-10-CM

## 2023-05-28 NOTE — Assessment & Plan Note (Signed)
 Patient was fitted for a standard, cushioned, Fit and Run semi-rigid orthotic.  The orthotic was heated and the patient stood on the orthotic blank positioned on the orthotic stand. The patient was positioned in subtalar neutral position and 10 degrees of ankle dorsiflexion in a weight bearing stance. size: 7 base: none posting: none additional orthotic padding: none Orthotics fit well and patients gait: was neutral with ambulation and running. She reported comfort and support I will be happy to make any adjustments in the future if needed.  We discussed continuing slow return to activity due to her previous stress fracture.

## 2023-05-28 NOTE — Assessment & Plan Note (Deleted)
 Patient was fitted for a standard, cushioned, Fit and Run semi-rigid orthotic.  The orthotic was heated and the patient stood on the orthotic blank positioned on the orthotic stand. The patient was positioned in subtalar neutral position and 10 degrees of ankle dorsiflexion in a weight bearing stance. size: 7 base: none posting: none additional orthotic padding: none Orthotics fit well and patients gait: was neutral with ambulation and running. She reported comfort and support I will be happy to make any adjustments in the future if needed.  We discussed continuing slow return to activity due to her previous stress fracture.

## 2023-05-28 NOTE — Progress Notes (Signed)
 Kathy Vaughan - 43 y.o. female MRN [id number]  Date of birth: 01/09/1981  PCP: Blair Heys, MD (Inactive)  Subjective:  No chief complaint on file.  orthotics  HPI: Past Medical, Surgical, Social, and Family History Reviewed & Updated per EMR.   Patient is a 43 y.o. female here for follow up on foot pain and need for orthotics.  She comes in today requesting a set of custom orthotics for arch support and prevention of further foot injuries.  Past Medical History:  Diagnosis Date   ADHD    Depressed     Current Outpatient Medications on File Prior to Visit  Medication Sig Dispense Refill   aspirin EC 81 MG tablet Take 1 tablet (81 mg total) by mouth 2 (two) times daily. 84 tablet 0   Brexpiprazole (REXULTI PO) Take 2 mg by mouth.     docusate sodium (COLACE) 100 MG capsule Take 1 capsule (100 mg total) by mouth 2 (two) times daily. While taking narcotic pain medicine. 30 capsule 0   DULoxetine HCl (CYMBALTA PO) Take 120 mg by mouth.     ondansetron (ZOFRAN ODT) 4 MG disintegrating tablet Take 1 tablet (4 mg total) by mouth every 8 (eight) hours as needed for nausea. 10 tablet 0   senna (SENOKOT) 8.6 MG TABS tablet Take 2 tablets (17.2 mg total) by mouth 2 (two) times daily. 30 tablet 0   tamsulosin (FLOMAX) 0.4 MG CAPS capsule Take 1 capsule (0.4 mg total) by mouth daily after supper. 10 capsule 0   VYVANSE 60 MG capsule Take 60 mg by mouth daily as needed.     WELLBUTRIN XL 150 MG 24 hr tablet Take 150 mg by mouth daily.     No current facility-administered medications on file prior to visit.    Past Surgical History:  Procedure Laterality Date   BUNIONECTOMY Left 02/08/2021   Procedure: Left Lapidus and Modified Orpha Bur;  Surgeon: Toni Arthurs, MD;  Location: [facility];  Service: Orthopedics;  Laterality: Left;   HAMMERTOE RECONSTRUCTION WITH WEIL OSTEOTOMY Left 02/08/2021   Procedure: Second Gerhard Munch Osteotomy and Hammertoe Correction with Flexor  Tenotomy;  Surgeon: Toni Arthurs, MD;  Location: [facility];  Service: Orthopedics;  Laterality: Left;   WISDOM TOOTH EXTRACTION      No Known Allergies      Objective:  Physical Exam: VS: BP:116/72  HR: bpm  TEMP: ( )  RESP:   HT:5\' 5"  (165.1 cm)   WT:135 lb (61.2 kg)  BMI:22.47  Gen: NAD, speaks clearly, comfortable in exam room Respiratory: Normal respiratory effort on room air. No signs of distress Skin: No rashes, abrasions, or ecchymosis MSK:  With standing there is right greater than left bilateral midfoot pronation The ankles are fairly neutral Right greater than left transverse arch breakdown There is mild breakdown of the right longitudinal arch with some rotation of the metatarsal causing of valgus formation Mild splaying of the 1-2 digits on the right Bunionette formation on the right first MTP    Assessment & Plan:   PAIN IN JOINT, ANKLE/FOOT Patient was fitted for a standard, cushioned, Fit and Run semi-rigid orthotic.  The orthotic was heated and the patient stood on the orthotic blank positioned on the orthotic stand. The patient was positioned in subtalar neutral position and 10 degrees of ankle dorsiflexion in a weight bearing stance. size: 7 base: none posting: none additional orthotic padding: none Orthotics fit well and patients gait: was neutral with ambulation and  running. She reported comfort and support I will be happy to make any adjustments in the future if needed.  We discussed continuing slow return to activity due to her previous stress fracture.     Rica Mote MD [facility] Sports Medicine Fellow

## 2023-06-04 DIAGNOSIS — M533 Sacrococcygeal disorders, not elsewhere classified: Secondary | ICD-10-CM | POA: Diagnosis not present

## 2023-06-04 DIAGNOSIS — M24152 Other articular cartilage disorders, left hip: Secondary | ICD-10-CM | POA: Diagnosis not present

## 2023-06-04 DIAGNOSIS — M9904 Segmental and somatic dysfunction of sacral region: Secondary | ICD-10-CM | POA: Diagnosis not present

## 2023-06-04 DIAGNOSIS — M25552 Pain in left hip: Secondary | ICD-10-CM | POA: Diagnosis not present

## 2023-06-04 DIAGNOSIS — M9902 Segmental and somatic dysfunction of thoracic region: Secondary | ICD-10-CM | POA: Diagnosis not present

## 2023-06-04 DIAGNOSIS — M9903 Segmental and somatic dysfunction of lumbar region: Secondary | ICD-10-CM | POA: Diagnosis not present

## 2023-06-04 DIAGNOSIS — M9905 Segmental and somatic dysfunction of pelvic region: Secondary | ICD-10-CM | POA: Diagnosis not present

## 2023-06-07 DIAGNOSIS — Z419 Encounter for procedure for purposes other than remedying health state, unspecified: Secondary | ICD-10-CM | POA: Diagnosis not present

## 2023-06-24 DIAGNOSIS — Z01419 Encounter for gynecological examination (general) (routine) without abnormal findings: Secondary | ICD-10-CM | POA: Diagnosis not present

## 2023-06-24 DIAGNOSIS — Z6822 Body mass index (BMI) 22.0-22.9, adult: Secondary | ICD-10-CM | POA: Diagnosis not present

## 2023-06-24 DIAGNOSIS — R87610 Atypical squamous cells of undetermined significance on cytologic smear of cervix (ASC-US): Secondary | ICD-10-CM | POA: Diagnosis not present

## 2023-06-24 DIAGNOSIS — Z1231 Encounter for screening mammogram for malignant neoplasm of breast: Secondary | ICD-10-CM | POA: Diagnosis not present

## 2023-07-03 DIAGNOSIS — M25551 Pain in right hip: Secondary | ICD-10-CM | POA: Diagnosis not present

## 2023-07-03 DIAGNOSIS — M4306 Spondylolysis, lumbar region: Secondary | ICD-10-CM | POA: Diagnosis not present

## 2023-07-03 DIAGNOSIS — R29898 Other symptoms and signs involving the musculoskeletal system: Secondary | ICD-10-CM | POA: Diagnosis not present

## 2023-07-07 DIAGNOSIS — Z419 Encounter for procedure for purposes other than remedying health state, unspecified: Secondary | ICD-10-CM | POA: Diagnosis not present

## 2023-07-09 ENCOUNTER — Ambulatory Visit (INDEPENDENT_AMBULATORY_CARE_PROVIDER_SITE_OTHER): Admitting: Family Medicine

## 2023-07-09 ENCOUNTER — Encounter: Payer: Self-pay | Admitting: Family Medicine

## 2023-07-09 ENCOUNTER — Other Ambulatory Visit: Payer: Self-pay

## 2023-07-09 VITALS — BP 120/82 | Ht 65.0 in | Wt 135.0 lb

## 2023-07-09 DIAGNOSIS — M79672 Pain in left foot: Secondary | ICD-10-CM | POA: Diagnosis not present

## 2023-07-09 DIAGNOSIS — M19072 Primary osteoarthritis, left ankle and foot: Secondary | ICD-10-CM | POA: Insufficient documentation

## 2023-07-09 NOTE — Progress Notes (Unsigned)
 Kathy Vaughan - 43 y.o. female MRN [id number]  Date of birth: 1980/11/10  PCP: Jannelle Memory, MD (Inactive)  Subjective:  No chief complaint on file.  Left foot pain  HPI: Past Medical, Surgical, Social, and Family History Reviewed & Updated per EMR.   Patient is a 43 y.o. female here for follow up on left foot pain. Her orthotics have been working well and she has not been running but went back to box jump workouts 3/week with HIT training. She has developed left sided dorsal foot pain just proximal to her previous area of stress fracture and wanted to make sure she did not re injure this. There is no pain at rest but some with walking. She denies any bruising, redness, warmth, fevers, numbness or weakness.   Past Medical History:  Diagnosis Date   ADHD    Depressed     Current Outpatient Medications on File Prior to Visit  Medication Sig Dispense Refill   aspirin  EC 81 MG tablet Take 1 tablet (81 mg total) by mouth 2 (two) times daily. 84 tablet 0   Brexpiprazole (REXULTI PO) Take 2 mg by mouth.     docusate sodium  (COLACE) 100 MG capsule Take 1 capsule (100 mg total) by mouth 2 (two) times daily. While taking narcotic pain medicine. 30 capsule 0   DULoxetine HCl (CYMBALTA PO) Take 120 mg by mouth.     ondansetron  (ZOFRAN  ODT) 4 MG disintegrating tablet Take 1 tablet (4 mg total) by mouth every 8 (eight) hours as needed for nausea. 10 tablet 0   senna (SENOKOT) 8.6 MG TABS tablet Take 2 tablets (17.2 mg total) by mouth 2 (two) times daily. 30 tablet 0   tamsulosin  (FLOMAX ) 0.4 MG CAPS capsule Take 1 capsule (0.4 mg total) by mouth daily after supper. 10 capsule 0   VYVANSE 60 MG capsule Take 60 mg by mouth daily as needed.     WELLBUTRIN XL 150 MG 24 hr tablet Take 150 mg by mouth daily.     No current facility-administered medications on file prior to visit.    Past Surgical History:  Procedure Laterality Date   BUNIONECTOMY Left 02/08/2021   Procedure: Left Lapidus and  Modified Phyllis Breeze;  Surgeon: Amada Backer, MD;  Location: [facility];  Service: Orthopedics;  Laterality: Left;   HAMMERTOE RECONSTRUCTION WITH WEIL OSTEOTOMY Left 02/08/2021   Procedure: Second Kelsey Patricia Osteotomy and Hammertoe Correction with Flexor Tenotomy;  Surgeon: Amada Backer, MD;  Location: [facility];  Service: Orthopedics;  Laterality: Left;   WISDOM TOOTH EXTRACTION      No Known Allergies      Objective:  Physical Exam: VS: BP:120/82  HR: bpm  TEMP: ( )  RESP:   HT:5\' 5"  (165.1 cm)   WT:135 lb (61.2 kg)  BMI:22.47  Gen: NAD, speaks clearly, comfortable in exam room Respiratory: Normal respiratory effort on room air. No signs of distress Skin: No rashes, abrasions, or ecchymosis MSK:  Left Foot:  Observation: no ecchymosis, edema, or deformity ROM of passive IR and ER of forefoot: normal ROM Phalanges: full Long arch: preserved Transverse arch: preserved 5th MT: NT 5th TMT joint w/ dorsal TTP  Gait: normal  Limited Ultrasound of the Left Foot:  - The and is in tact w/ no hypoechoic changes or free floating calcifications indicative of avulsion. There is a well healed area of new bone over the previous proximal dorsal sided stress fracture - The 5th TMT joint shows marked  osteophyte formation dorsally with overlying hypoechoic fluid accumulation but no osteal defects.   Summary: edema overlying 5th TMT OA  Ultrasound and interpretation by Dr. Dannis Dy and Dr. Howard Macho     Assessment & Plan:   Osteoarthritis of midfoot, left - The pt did start with rapid re entry to jumping exercises but no running  - Her exam and US  findings show 5th TMT joint OA w/ overlying edema but her previous MT stress fx is well healed.  - I discussed treatment options with the patient. She prefers to try conservative management first which I agree with. She will start with an arch strap, ice, voltaren gel, and oral Naproxen BID for 3 weeks and  modify intensity/repetitions of her workout to avoid painful movement.  - Follow up in 4 weeks. If not improved then we can consider injection therapy.     Berneda Bridges MD [facility] Sports Medicine Fellow

## 2023-07-09 NOTE — Assessment & Plan Note (Signed)
-   The pt did start with rapid re entry to jumping exercises but no running  - Her exam and US  findings show 5th TMT joint OA w/ overlying edema but her previous MT stress fx is well healed.  - I discussed treatment options with the patient. She prefers to try conservative management first which I agree with. She will start with an arch strap, ice, voltaren gel, and oral Naproxen BID for 3 weeks and modify intensity/repetitions of her workout to avoid painful movement.  - Follow up in 4 weeks. If not improved then we can consider injection therapy.

## 2023-07-17 DIAGNOSIS — Z30433 Encounter for removal and reinsertion of intrauterine contraceptive device: Secondary | ICD-10-CM | POA: Diagnosis not present

## 2023-07-30 ENCOUNTER — Encounter: Payer: Self-pay | Admitting: Family Medicine

## 2023-07-30 ENCOUNTER — Ambulatory Visit (INDEPENDENT_AMBULATORY_CARE_PROVIDER_SITE_OTHER): Admitting: Family Medicine

## 2023-07-30 VITALS — BP 112/84 | Ht 65.0 in | Wt 135.0 lb

## 2023-07-30 DIAGNOSIS — M19072 Primary osteoarthritis, left ankle and foot: Secondary | ICD-10-CM

## 2023-07-30 NOTE — Assessment & Plan Note (Signed)
-   The patient is have markedly improved with use of the arch strap and activity modification. - We discussed further management options including injection therapy or shockwave therapy. - At this point she would like to continue conservative management.  We will follow-up with her as needed.

## 2023-07-30 NOTE — Progress Notes (Signed)
  Kathy Vaughan - 43 y.o. female MRN [id number]  Date of birth: Feb 09, 1981  PCP: Jannelle Memory, MD (Inactive)  Subjective:  No chief complaint on file. Left midfoot arthritis  HPI: Past Medical, Surgical, Social, and Family History Reviewed & Updated per EMR.   Patient is a 43 y.o. female here for follow up on her midfoot arthritis on the left.  Since her last visit she has been using an arch strap which is given her marked support and relief of her pain.  She has been avoiding running and repetitive jumping but is able to do most of her activities without issue.  She denies any swelling, redness, or numbness.  Past Medical History:  Diagnosis Date   ADHD    Depressed     No current outpatient medications on file prior to visit.   No current facility-administered medications on file prior to visit.    Past Surgical History:  Procedure Laterality Date   BUNIONECTOMY Left 02/08/2021   Procedure: Left Lapidus and Modified Phyllis Breeze;  Surgeon: Amada Backer, MD;  Location: [facility];  Service: Orthopedics;  Laterality: Left;   HAMMERTOE RECONSTRUCTION WITH WEIL OSTEOTOMY Left 02/08/2021   Procedure: Second Kelsey Patricia Osteotomy and Hammertoe Correction with Flexor Tenotomy;  Surgeon: Amada Backer, MD;  Location: [facility];  Service: Orthopedics;  Laterality: Left;   WISDOM TOOTH EXTRACTION      No Known Allergies      Objective:  Physical Exam: VS: BP:112/84  HR: bpm  TEMP: ( )  RESP:   HT:5\' 5"  (165.1 cm)   WT:135 lb (61.2 kg)  BMI:22.47  Gen: NAD, speaks clearly, comfortable in exam room Respiratory: Normal respiratory effort on room air. No signs of distress Skin: No rashes, abrasions, or ecchymosis MSK:  Left foot:  Observation: No obvious deformity, erythema, or edema ROM of passive IR and ER of forefoot: Normal Mild tenderness palpation over the dorsal aspect of the midfoot at the 5th TMT joint Dorsalis Pedis pulse: 2+, and  equal bilat Sensation: intact Gait: Normal     Assessment & Plan:   Osteoarthritis of midfoot, left - The patient is have markedly improved with use of the arch strap and activity modification. - We discussed further management options including injection therapy or shockwave therapy. - At this point she would like to continue conservative management.  We will follow-up with her as needed.    Berneda Bridges MD [facility] Sports Medicine Fellow

## 2023-08-13 DIAGNOSIS — M79652 Pain in left thigh: Secondary | ICD-10-CM | POA: Diagnosis not present

## 2023-08-13 DIAGNOSIS — M25552 Pain in left hip: Secondary | ICD-10-CM | POA: Diagnosis not present

## 2023-08-13 DIAGNOSIS — M79672 Pain in left foot: Secondary | ICD-10-CM | POA: Diagnosis not present

## 2023-08-13 DIAGNOSIS — M9903 Segmental and somatic dysfunction of lumbar region: Secondary | ICD-10-CM | POA: Diagnosis not present

## 2023-08-21 ENCOUNTER — Ambulatory Visit (INDEPENDENT_AMBULATORY_CARE_PROVIDER_SITE_OTHER): Payer: Self-pay | Admitting: Sports Medicine

## 2023-08-21 DIAGNOSIS — M19072 Primary osteoarthritis, left ankle and foot: Secondary | ICD-10-CM

## 2023-08-21 NOTE — Assessment & Plan Note (Signed)
 Trial with ESWT 2/2 to pain will likely need to keep mJ at lower leve. Used 70 mJ today.  Session #1

## 2023-08-21 NOTE — Progress Notes (Signed)
 PCP: Hugh Charleston, MD (Inactive)  Subjective:   HPI: Patient is a 43 y.o. female here for midfoot shockwave therapy.  Minerva has a history of a bunionectomy and 2nd toe ligament release on the left foot but is now struggling from midfoot pain secondary to midfoot OA. We will be trialing ECSWT today.   Past Medical History:  Diagnosis Date   ADHD    Depressed     No current outpatient medications on file prior to visit.   No current facility-administered medications on file prior to visit.    Past Surgical History:  Procedure Laterality Date   BUNIONECTOMY Left 02/08/2021   Procedure: Left Lapidus and Modified Woodard Edwards;  Surgeon: Kit Rush, MD;  Location: [facility];  Service: Orthopedics;  Laterality: Left;   HAMMERTOE RECONSTRUCTION WITH WEIL OSTEOTOMY Left 02/08/2021   Procedure: Second Agustina Osteotomy and Hammertoe Correction with Flexor Tenotomy;  Surgeon: Kit Rush, MD;  Location: [facility];  Service: Orthopedics;  Laterality: Left;   WISDOM TOOTH EXTRACTION      No Known Allergies  There were no vitals taken for this visit.     10/06/2020   10:35 AM 11/02/2020   10:59 AM  [facility] Adult Exercise  Frequency of aerobic exercise (# of days/week) 7 5  Average time in minutes 50 60  Frequency of strengthening activities (# of days/week) 2 3        No data to display              Objective:  Physical Exam:  Left Foot Inspection: No bruises, swelling. Longitudinal arch flattening with at the midfoot  Palpation: Tender to medial TMT joints across to base of 5th MT Mild TMT bossing ROM: Hallux flexion 10 degrees, Hallux extension 10    Assessment & Plan:  Patient is a 43 y.o. female here for left midfoot ECSWT.   Procedure: ECSWT Indications:  Left midfoot arthritis    Procedure Details Consent: Risks of procedure as well as the alternatives and risks of each were explained to the patient.   Written consent for procedure obtained. Time Out: Verified patient identification, verified procedure, site was marked, verified correct patient position, medications/allergies/relevent history reviewed.  The area was cleaned with alcohol swab.     The 5th TMT joint was targeted for Extracorporeal shockwave therapy.    Preset: Achillodynia Power Level: 70mJ Frequency: 12 Hz Impulse/cycles: 2000 impulses Head size: Large   Patient tolerated procedure without immediate complications. She was at the limit of her pain tolerance with this cycle.     Bernardino Grip, MS4 [employer] [facility]

## 2023-08-28 ENCOUNTER — Encounter: Payer: Self-pay | Admitting: Sports Medicine

## 2023-08-28 ENCOUNTER — Ambulatory Visit: Payer: Self-pay | Admitting: Sports Medicine

## 2023-08-28 VITALS — Ht 65.0 in | Wt 135.0 lb

## 2023-08-28 DIAGNOSIS — M19072 Primary osteoarthritis, left ankle and foot: Secondary | ICD-10-CM

## 2023-08-28 NOTE — Assessment & Plan Note (Signed)
 Today we did ESWT 2 of a planned 4 to 6 sessions

## 2023-08-28 NOTE — Progress Notes (Signed)
 ESWT # 2 The 1st to 5th TMT joints were targeted for Extracorporeal shockwave therapy.    Preset: Achillodynia Power Level: 60 mJ Frequency: 14 Hz Impulse/cycles: 2500 impulses Head size: Large   Patient tolerated procedure without immediate complications. She was at the limit of her pain tolerance with this cycle so I lowered power to 60mJ and kept at that level.  Plan is to try 4 to 6 sessions unless sxs are just not improving. She finds the custom orthotics are great.  Kathy Haddock, MD.

## 2023-09-03 ENCOUNTER — Ambulatory Visit (INDEPENDENT_AMBULATORY_CARE_PROVIDER_SITE_OTHER): Payer: Self-pay | Admitting: Family Medicine

## 2023-09-03 ENCOUNTER — Encounter: Payer: Self-pay | Admitting: Sports Medicine

## 2023-09-03 DIAGNOSIS — M19072 Primary osteoarthritis, left ankle and foot: Secondary | ICD-10-CM

## 2023-09-03 NOTE — Progress Notes (Signed)
 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 She had more bruising and discomfort with last treatment - still some resolving bruising on top of her foot.  Procedure: ECSWT Indications:  left midfoot arthritis   Procedure Details Consent: Risks of procedure as well as the alternatives and risks of each were explained to the patient.  Written consent for procedure obtained. Time Out: Verified patient identification, verified procedure, site was marked, verified correct patient position, medications/allergies/relevent history reviewed.  The area was cleaned with alcohol swab.     The Left 1st-5th TMT joints were targeted for Extracorporeal shockwave therapy.    Preset: achillodynia Power Level: 60 mJ Frequency: 14Hz  Impulse/cycles: 1500 impulses Head size: large   Procedure was aborted after 1500 impulses.  She was getting some active bruising during procedure.  Excellent dp pulse and cap refill < 2 sec, no arterial component to bruising.  Advised to ice when returns home, take tylenol , use her compression sleeve.  She has appointment with Dr. Harvey next week which was to be her 4th treatment but advised evaluation at that time and consideration of next steps.

## 2023-09-10 DIAGNOSIS — M2022 Hallux rigidus, left foot: Secondary | ICD-10-CM | POA: Diagnosis not present

## 2023-09-11 ENCOUNTER — Ambulatory Visit (INDEPENDENT_AMBULATORY_CARE_PROVIDER_SITE_OTHER): Payer: Self-pay | Admitting: Sports Medicine

## 2023-09-11 VITALS — BP 104/78 | Ht 65.0 in | Wt 137.0 lb

## 2023-09-11 DIAGNOSIS — M19072 Primary osteoarthritis, left ankle and foot: Secondary | ICD-10-CM

## 2023-09-11 NOTE — Progress Notes (Signed)
 Chief complaint arthritis of the left midfoot  Patient was given a trial of ESWT and return for another treatment today.  She did not get that much relief.  More worrisome is that she got significant bruising and swelling at the midfoot and down toward the forefoot on the dorsum of the left.  I suggested that we look at other options as I worry about the shockwave causing too much bruising.  She did have an opinion from Dr. Kit about her left hallux rigidus.  He suggested using a first ray post modification until it got bad enough that she would want surgery.  Today she states that her pain is primarily at the first MTP joint not so much at the midfoot nor at the fifth MTP joint where she had a recent stress fracture.  Exam Pleasant female no acute distress BP 104/78   Ht 5' 5 (1.651 m)   Wt 137 lb (62.1 kg)   BMI 22.80 kg/m   Very limited motion of her left great toe in flexion or extension Some metatarsal bossing primarily at the first TMT joint and at the 4th and 5th TMT joints She has mild first metatarsal insufficiency  Ultrasound screening There is significant arthritis changes at the left first TMT joint with fragmentation, hypoechoic change. The fifth TMT joint also shows fragmentation and spurring Some minimal change of the fourth TMT 2nd and 3rd TMT's are normal in appearance First MTP joint shows a moderately large dorsal spur but most of the joint space is preserved  Impression Arthritis primarily at the 1st and 5th TMT joints and the first MTP joint of the left foot  Ultrasound and interpretation by Helene B. Harvey, MD

## 2023-09-11 NOTE — Assessment & Plan Note (Signed)
 Trial of ESWT was unsuccessful for pain relief and caused bruising She has a good orthotic so today I put a first ray post extending under the great toe on to her custom orthotics  I advised a change to avoid significant toe off or other activities that put too much stress on the left first MTP joint  Continue to use the midfoot arch strap as this seems to help  We will follow this to see how she does

## 2023-09-17 ENCOUNTER — Ambulatory Visit: Admitting: Family Medicine

## 2023-09-17 DIAGNOSIS — M25552 Pain in left hip: Secondary | ICD-10-CM | POA: Diagnosis not present

## 2023-09-17 DIAGNOSIS — M79672 Pain in left foot: Secondary | ICD-10-CM | POA: Diagnosis not present

## 2023-09-17 DIAGNOSIS — M19072 Primary osteoarthritis, left ankle and foot: Secondary | ICD-10-CM | POA: Diagnosis not present

## 2023-09-17 DIAGNOSIS — M79605 Pain in left leg: Secondary | ICD-10-CM | POA: Diagnosis not present

## 2023-09-24 ENCOUNTER — Encounter: Payer: Self-pay | Admitting: *Deleted

## 2023-09-24 ENCOUNTER — Ambulatory Visit: Payer: Self-pay | Admitting: Diagnostic Neuroimaging

## 2023-09-24 ENCOUNTER — Ambulatory Visit: Payer: Self-pay | Admitting: Neurology

## 2023-09-24 VITALS — BP 120/69 | HR 82 | Ht 65.5 in | Wt 137.8 lb

## 2023-09-24 DIAGNOSIS — R259 Unspecified abnormal involuntary movements: Secondary | ICD-10-CM

## 2023-09-24 DIAGNOSIS — G2570 Drug induced movement disorder, unspecified: Secondary | ICD-10-CM

## 2023-09-24 NOTE — Patient Instructions (Signed)
 Your involuntary tongue movements are likely secondary to medication effect.  I did not see any significant involuntary tongue movements today on exam and no other involuntary movements were detected.  Your history does not suggest any obvious hereditary cause for involuntary movements.   We will do some blood work today and call you with the results. You can continue with symptomatic treatment through your psychiatry provider. I do not see a pressing reason to proceed with a brain scan at this time. Please reduce your caffeine intake and limit yourself to 1-2 servings per day. Avoid drinking alcohol as alcohol may not combine well with your current medication regimen. Continue to stay well-hydrated and well rested. I would be happy to see you back as needed.

## 2023-09-24 NOTE — Progress Notes (Signed)
 Subjective:    Patient ID: Kathy Vaughan is a 43 y.o. female.  HPI    True Mar, MD, PhD [facility] [address], [address] [address] [place], [place] [address]  Dear Kathy Vaughan,   I saw your patient, Kathy Vaughan, upon your kind request in my neurologic clinic today for initial consultation of her involuntary tongue movements.  The patient is unaccompanied today.  As you know, Kathy Vaughan is a 43 year old female with an underlying medical history of ADHD, depression, arthritis, kidney stone, and pityriasis versicolor, who reports a several month history of involuntary tongue movements, particularly involuntary tongue thrusting.  Symptoms are currently at bay with Cogentin.  She had blood work several months ago through her nephrologist and also from her PCP but results are not available for my review today.  She is supposed to establish with a new PCP as her previous primary care physician has retired.  I reviewed your office note from 08/05/2018 for, at which time she was on lurasidone, Trintellix, Adzenys, and Wellbutrin.  I also reviewed your office visit note from 05/13/2018 for as well as 11/04/2022 and 02/03/2023.  At that visit, she reported tongue thrusting was improved with Cogentin 1 mg nightly but she had dry mouth from it.  She was advised to discontinue Cogentin and try Ingrezza.  I do not have any more recent visit notes for review.  She is no longer on Ingrezza.  She also tried amantadine, currently on benztropine again.  She is no longer on amantadine which she tried recently. She has no family history of Huntington's disease, mom has dysphonia.  She has not noticed any other involuntary movements in her body, no sudden onset of one-sided weakness or numbness or tingling or droopy face or slurring of speech. She tries to hydrate well with water, she drinks caffeine in the form of green tea, 3 cups in the mornings.  She drinks alcohol on the weekends.  Her Past  Medical History Is Significant For: Past Medical History:  Diagnosis Date   ADHD    Anxiety disorder, unspecified    Depressed     Her Past Surgical History Is Significant For: Past Surgical History:  Procedure Laterality Date   BUNIONECTOMY Left 02/08/2021   Procedure: Left Lapidus and Modified Woodard Edwards;  Surgeon: Kit Rush, MD;  Location: [facility];  Service: Orthopedics;  Laterality: Left;   HAMMERTOE RECONSTRUCTION WITH WEIL OSTEOTOMY Left 02/08/2021   Procedure: Second Agustina Osteotomy and Hammertoe Correction with Flexor Tenotomy;  Surgeon: Kit Rush, MD;  Location: [facility];  Service: Orthopedics;  Laterality: Left;   WISDOM TOOTH EXTRACTION      Her Family History Is Significant For: Family History  Problem Relation Age of Onset   Dystonia Mother     Her Social History Is Significant For: Social History   Socioeconomic History   Marital status: Single    Spouse name: Not on file   Number of children: Not on file   Years of education: Not on file   Highest education level: Not on file  Occupational History   Not on file  Tobacco Use   Smoking status: Never   Smokeless tobacco: Never  Vaping Use   Vaping status: Never Used  Substance and Sexual Activity   Alcohol use: Yes    Comment: occasional   Drug use: Never   Sexual activity: Not on file  Other Topics Concern   Not on file  Social History  Narrative   Caffieine 3 cps green tea   Working:  Tree [employer]   Lives partner, dog.    Social Drivers of Corporate investment banker Strain: Not on file  Food Insecurity: Not on file  Transportation Needs: Not on file  Physical Activity: Not on file  Stress: Not on file  Social Connections: Not on file    Her Allergies Are:  No Known Allergies:   Her Current Medications Are:  Outpatient Encounter Medications as of 09/24/2023  Medication Sig   ADZENYS XR-ODT 18.8 MG TBED DISSOLVE 1 TABLET  ON THE TONGUE EVERY MORNING   benztropine (COGENTIN) 1 MG tablet Take 1 mg by mouth daily.   buPROPion (WELLBUTRIN XL) 300 MG 24 hr tablet Take 300 mg by mouth daily.   Lurasidone HCl 60 MG TABS TAKE 1 TABLET BY MOUTH EVERY DAY WITH A MEAL   MAGNESIUM    Omega-3 Fatty Acids (FISH OIL) 1000 MG CAPS Fish Oil   simethicone (MYLICON) 80 MG chewable tablet Chew 80 mg by mouth every 6 (six) hours as needed for flatulence.   TRINTELLIX 10 MG TABS tablet Take 10 mg by mouth daily.   No facility-administered encounter medications on file as of 09/24/2023.  :   Review of Systems:  Out of a complete 14 point review of systems, all are reviewed and negative with the exception of these symptoms as listed below:   Review of Systems  Neurological:        Pt here since last summer, involuntary movements of tongue.  Tried amantidine, ingrezza an now benztropine.      Objective:  Neurological Exam  Physical Exam Physical Examination:   Vitals:   09/24/23 0821  BP: 120/69  Pulse: 82    General Examination: The patient is a very pleasant 43 y.o. female in no acute distress. She appears well-developed and well-nourished and well groomed.   HEENT: Normocephalic, atraumatic, pupils are equal, round and reactive to light, extraocular tracking is good without limitation to gaze excursion or nystagmus noted. Hearing is grossly intact. Face is symmetric with normal facial animation and normal facial sensation to light touch, temperature and vibration sense. Speech is clear with no dysarthria noted. There is no hypophonia. There is no lip, neck/head, jaw or voice tremor. Neck is supple with full range of passive and active motion. There are no carotid bruits on auscultation. Oropharynx exam reveals: No obvious involuntary tongue movements or dyskinesias noted.  No motor impersistence noted.  Mild to moderate mouth dryness noted.  Tongue protrudes centrally and palate elevates symmetrically.    Chest: Clear  to auscultation without wheezing, rhonchi or crackles noted.  Heart: S1+S2+0, regular and normal without murmurs, rubs or gallops noted.   Abdomen: Soft, non-tender and non-distended.  Extremities: There is no pitting edema in the distal lower extremities bilaterally.   Skin: Warm and dry without trophic changes noted.   Musculoskeletal: exam reveals no obvious joint deformities.   Neurologically:  Mental status: The patient is awake, alert and oriented in all 4 spheres. Her immediate and remote memory, attention, language skills and fund of knowledge are appropriate. There is no evidence of aphasia, agnosia, apraxia or anomia. Speech is clear with normal prosody and enunciation. Thought process is linear. Mood is normal and affect is normal.  Cranial nerves II - XII are as described above under HEENT exam.  Motor exam: Normal bulk, strength and tone is noted. There is no obvious action or resting tremor.  No drift or rebound, no postural or intention tremor.  No involuntary athetoid or dyskinetic movements.  No twitching. Fine motor skills and coordination: intact finger taps, hand movements and rapid alternating patting with both upper extremities, normal foot taps bilaterally in the lower extremities.  Cerebellar testing: No dysmetria or intention tremor. There is no truncal or gait ataxia.  Normal finger-to-nose, normal heel-to-shin bilaterally.  Reflexes 2+ throughout, toes are downgoing bilaterally. Romberg negative.  Sensory exam: intact to light touch, temperature and vibration sense in the upper and lower extremities.  Gait, station and balance: She stands easily. No veering to one side is noted. No leaning to one side is noted. Posture is age-appropriate and stance is narrow based. Gait shows normal stride length and normal pace. No problems turning are noted. Normal tandem walk. Assessment:  In summary, Kathy Vaughan is a 43 year old female with an underlying medical history of  ADHD, depression, arthritis, kidney stone, and pityriasis versicolor, who presents for evaluation of involuntary tongue movements.  She reports a several month history.  She adds that she started having movements after she had been on lurasidone for a long time.  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  She has found some improvement with Cogentin but does suffer from dry mouth.  She has tried [place] as well as amantadine.  She does not have a family history of abnormal involuntary movements, particularly, no history of Huntington's disease.  We talked about genetic testing but mutually agreed not to pursue any genetic testing at this time.  History and examination otherwise are benign, I do not see a pressing reason to pursue a brain MRI at this time.  We can do some blood work, not sure which she has had checked recently.  We will check inflammatory and autoimmune markers, thyroid  screening test and vitamin deficiencies.  She is agreeable.  I had a long discussion with the patient, she was largely reassured.  She is encouraged to follow-up with your office as scheduled/planned.  Below is a summary of my discussion points and recommendations based on chart review, history, physical exam and neurological exam in particular.  She was given these instructions verbally during the visit and also in writing in her after visit summary.  I would be happy to see her back as needed.   << Your involuntary tongue movements are likely secondary to medication effect.  I did not see any significant involuntary tongue movements today on exam and no other involuntary movements were detected.  Your history does not suggest any obvious hereditary cause for involuntary movements.   We will do some blood work today and call you with the results. You can continue with symptomatic treatment through your psychiatry  provider. I do not see a pressing reason to proceed with a brain scan at this time. Please reduce your caffeine intake and limit yourself to 1-2 servings per day. Avoid drinking alcohol as alcohol may not combine well with your current medication regimen. Continue to stay well-hydrated and well rested. I would be happy to see you back as needed.  >>   Thank you very much for allowing me to participate in the care of this nice patient. If I can be of any further assistance to you please do not hesitate to call me at [phone].  Sincerely,   True Mar, MD, PhD

## 2023-09-25 ENCOUNTER — Ambulatory Visit: Admitting: Sports Medicine

## 2023-09-25 ENCOUNTER — Ambulatory Visit: Payer: Self-pay | Admitting: Neurology

## 2023-09-25 NOTE — Telephone Encounter (Signed)
-----   Message from True Mar sent at 09/25/2023  8:31 AM EDT ----- See MyChart message to patient, FYI.  ----- Message ----- From: Rebecka Memos Lab Results In Sent: 09/25/2023   7:37 AM EDT To: True Mar, MD

## 2023-09-25 NOTE — Telephone Encounter (Signed)
 Spoke to patient gave labwok results and Dr Amelia recommendation Pt expressed understanding and thanked me for calling

## 2023-09-25 NOTE — Telephone Encounter (Signed)
LVM for patient to call back and discuss lab results.

## 2023-09-27 LAB — RHEUMATOID FACTOR: Rheumatoid fact SerPl-aCnc: <10 [IU]/mL (ref <14.0)

## 2023-09-27 LAB — COMPREHENSIVE METABOLIC PANEL WITH GFR
ALT: 23 IU/L (ref 0–32)
AST: 29 IU/L (ref 0–40)
Albumin: 4.5 g/dL (ref 3.9–4.9)
Alkaline Phosphatase: 65 IU/L (ref 44–121)
BUN/Creatinine Ratio: 22 (ref 9–23)
BUN: 25 mg/dL — ABNORMAL HIGH (ref 6–24)
Bilirubin Total: 0.3 mg/dL (ref 0.0–1.2)
CO2: 22 mmol/L (ref 20–29)
Calcium: 9.8 mg/dL (ref 8.7–10.2)
Chloride: 100 mmol/L (ref 96–106)
Creatinine, Ser: 1.16 mg/dL — ABNORMAL HIGH (ref 0.57–1.00)
Globulin, Total: 2.2 g/dL (ref 1.5–4.5)
Glucose: 90 mg/dL (ref 70–99)
Potassium: 4.5 mmol/L (ref 3.5–5.2)
Sodium: 138 mmol/L (ref 134–144)
Total Protein: 6.7 g/dL (ref 6.0–8.5)
eGFR: 60 mL/min/1.73 (ref >59)

## 2023-09-27 LAB — VITAMIN D 25 HYDROXY (VIT D DEFICIENCY, FRACTURES): Vit D, 25-Hydroxy: 85.1 ng/mL (ref 30.0–100.0)

## 2023-09-27 LAB — SEDIMENTATION RATE: Sed Rate: 2 mm/h (ref 0–32)

## 2023-09-27 LAB — TSH: TSH: 1.4 u[IU]/mL (ref 0.450–4.500)

## 2023-09-27 LAB — VITAMIN B1: Thiamine: 122 nmol/L (ref 66.5–200.0)

## 2023-09-27 LAB — B12 AND FOLATE PANEL
Folate: >20 ng/mL (ref >3.0)
Vitamin B-12: 1043 pg/mL (ref 232–1245)

## 2023-09-27 LAB — ANA W/REFLEX: Anti Nuclear Antibody (ANA): NEGATIVE

## 2023-09-27 LAB — HGB A1C W/O EAG: Hgb A1c MFr Bld: 5.4 % (ref 4.8–5.6)

## 2023-09-27 LAB — AMMONIA: Ammonia: 25 ug/dL — ABNORMAL LOW (ref 31–155)

## 2023-09-30 DIAGNOSIS — N939 Abnormal uterine and vaginal bleeding, unspecified: Secondary | ICD-10-CM | POA: Diagnosis not present

## 2023-09-30 DIAGNOSIS — Z30431 Encounter for routine checking of intrauterine contraceptive device: Secondary | ICD-10-CM | POA: Diagnosis not present

## 2023-10-14 ENCOUNTER — Ambulatory Visit (INDEPENDENT_AMBULATORY_CARE_PROVIDER_SITE_OTHER): Admitting: Sports Medicine

## 2023-10-14 VITALS — BP 124/82 | Ht 65.0 in | Wt 137.0 lb

## 2023-10-14 DIAGNOSIS — N2581 Secondary hyperparathyroidism of renal origin: Secondary | ICD-10-CM | POA: Diagnosis not present

## 2023-10-14 DIAGNOSIS — N179 Acute kidney failure, unspecified: Secondary | ICD-10-CM | POA: Diagnosis not present

## 2023-10-14 DIAGNOSIS — M19072 Primary osteoarthritis, left ankle and foot: Secondary | ICD-10-CM

## 2023-10-14 NOTE — Progress Notes (Signed)
  PCP: Hugh Charleston, MD (Inactive)  Subjective:   HPI: Patient is a 43 y.o. female here for for follow-up of arthritis of the left midfoot.  Patient previously underwent ESWT with little success and significant bruising.  Patient had an ultrasound today at her last visit on 09/11/2023 which showed arthritis of the first TMT joint with fragmentation, fifth TMT joint with fragmentation and spurring, with a large dorsal spur at the first MTP joint.  At that visit, a first ray post was placed under the left orthotic.  Patient states that she has not been in any pain.  She has not been returning to her regular running activity since February.  She feels like the first right pelvis is been fine and has not caused any pain and has been comfortable.  Past Medical History:  Diagnosis Date   ADHD    Anxiety disorder, unspecified    Depressed     Current Outpatient Medications on File Prior to Visit  Medication Sig Dispense Refill   ADZENYS XR-ODT 18.8 MG TBED DISSOLVE 1 TABLET ON THE TONGUE EVERY MORNING     benztropine (COGENTIN) 1 MG tablet Take 1 mg by mouth daily.     buPROPion (WELLBUTRIN XL) 300 MG 24 hr tablet Take 300 mg by mouth daily.     Lurasidone HCl 60 MG TABS TAKE 1 TABLET BY MOUTH EVERY DAY WITH A MEAL     MAGNESIUM      Omega-3 Fatty Acids (FISH OIL) 1000 MG CAPS Fish Oil     simethicone (MYLICON) 80 MG chewable tablet Chew 80 mg by mouth every 6 (six) hours as needed for flatulence.     TRINTELLIX 10 MG TABS tablet Take 10 mg by mouth daily.     No current facility-administered medications on file prior to visit.    BP 124/82   Ht 5' 5 (1.651 m)   Wt 137 lb (62.1 kg)   BMI 22.80 kg/m        Objective:   Physical Exam:  Gen: NAD, comfortable in exam room Left foot Inspection: No effusion, erythema, warmth, or edema.  Mild hallux valgus of the great toe slightly, knuckle pads present dorsally on toes 2 and 3 Palpation: No tenderness to palpation ROM: Patient has  good range of motion at the ankle with plantarflexion dorsiflexion, first great toe extension and flexion are both limited, with flexion causing pain Neuro: Gait is normal, pulses palpable and symmetric  Running gait analysis: Patient predominantly strikes on her forefoot bilaterally, good running form  Assessment/Plan:   Taziyah Iannuzzi is a 43 y.o. female who was seen today for the following: 1. Arthritis of left midfoot (Primary) -Discussed with patient that she can start running with a plan to slowly increase effort -She may start with 3 times a week, and can also make start some moderate walking with her runs at the beginning to see how she handles this -Recommended continuing her Voltaren gel up to 3 times daily as needed -Patient can follow-up as needed   Follow-up/Education:   Return if symptoms worsen or fail to improve.   May return sooner as needed and encouraged to call/e-mail for additional questions or  worsening symptoms in the interim.  Krystal Lowing, DO Sports Medicine Fellow 10/14/2023 9:18 AM

## 2023-10-16 DIAGNOSIS — N261 Atrophy of kidney (terminal): Secondary | ICD-10-CM | POA: Diagnosis not present

## 2023-10-16 DIAGNOSIS — D631 Anemia in chronic kidney disease: Secondary | ICD-10-CM | POA: Diagnosis not present

## 2023-10-16 DIAGNOSIS — N2581 Secondary hyperparathyroidism of renal origin: Secondary | ICD-10-CM | POA: Diagnosis not present

## 2023-10-16 DIAGNOSIS — N182 Chronic kidney disease, stage 2 (mild): Secondary | ICD-10-CM | POA: Diagnosis not present

## 2023-10-22 DIAGNOSIS — M79652 Pain in left thigh: Secondary | ICD-10-CM | POA: Diagnosis not present

## 2023-10-22 DIAGNOSIS — G8929 Other chronic pain: Secondary | ICD-10-CM | POA: Diagnosis not present

## 2023-10-22 DIAGNOSIS — M25552 Pain in left hip: Secondary | ICD-10-CM | POA: Diagnosis not present

## 2023-10-22 DIAGNOSIS — M5459 Other low back pain: Secondary | ICD-10-CM | POA: Diagnosis not present

## 2023-11-26 DIAGNOSIS — M1990 Unspecified osteoarthritis, unspecified site: Secondary | ICD-10-CM | POA: Diagnosis not present

## 2023-11-26 DIAGNOSIS — M25551 Pain in right hip: Secondary | ICD-10-CM | POA: Diagnosis not present

## 2023-11-26 DIAGNOSIS — M79605 Pain in left leg: Secondary | ICD-10-CM | POA: Diagnosis not present

## 2023-11-26 DIAGNOSIS — M9908 Segmental and somatic dysfunction of rib cage: Secondary | ICD-10-CM | POA: Diagnosis not present

## 2023-11-26 DIAGNOSIS — M9906 Segmental and somatic dysfunction of lower extremity: Secondary | ICD-10-CM | POA: Diagnosis not present

## 2023-11-26 DIAGNOSIS — M25552 Pain in left hip: Secondary | ICD-10-CM | POA: Diagnosis not present

## 2023-11-26 DIAGNOSIS — M9904 Segmental and somatic dysfunction of sacral region: Secondary | ICD-10-CM | POA: Diagnosis not present

## 2023-11-26 DIAGNOSIS — M9905 Segmental and somatic dysfunction of pelvic region: Secondary | ICD-10-CM | POA: Diagnosis not present

## 2023-12-24 DIAGNOSIS — F900 Attention-deficit hyperactivity disorder, predominantly inattentive type: Secondary | ICD-10-CM | POA: Diagnosis not present

## 2023-12-24 DIAGNOSIS — F3341 Major depressive disorder, recurrent, in partial remission: Secondary | ICD-10-CM | POA: Diagnosis not present

## 2023-12-24 DIAGNOSIS — F419 Anxiety disorder, unspecified: Secondary | ICD-10-CM | POA: Diagnosis not present

## 2023-12-24 DIAGNOSIS — Z5181 Encounter for therapeutic drug level monitoring: Secondary | ICD-10-CM | POA: Diagnosis not present

## 2024-01-01 DIAGNOSIS — F902 Attention-deficit hyperactivity disorder, combined type: Secondary | ICD-10-CM | POA: Diagnosis not present

## 2024-01-14 DIAGNOSIS — M9902 Segmental and somatic dysfunction of thoracic region: Secondary | ICD-10-CM | POA: Diagnosis not present

## 2024-01-14 DIAGNOSIS — M25552 Pain in left hip: Secondary | ICD-10-CM | POA: Diagnosis not present

## 2024-01-14 DIAGNOSIS — M9903 Segmental and somatic dysfunction of lumbar region: Secondary | ICD-10-CM | POA: Diagnosis not present

## 2024-01-14 DIAGNOSIS — M9906 Segmental and somatic dysfunction of lower extremity: Secondary | ICD-10-CM | POA: Diagnosis not present

## 2024-01-14 DIAGNOSIS — F902 Attention-deficit hyperactivity disorder, combined type: Secondary | ICD-10-CM | POA: Diagnosis not present

## 2024-01-29 DIAGNOSIS — F902 Attention-deficit hyperactivity disorder, combined type: Secondary | ICD-10-CM | POA: Diagnosis not present

## 2024-02-10 DIAGNOSIS — F419 Anxiety disorder, unspecified: Secondary | ICD-10-CM | POA: Diagnosis not present

## 2024-02-10 DIAGNOSIS — F3341 Major depressive disorder, recurrent, in partial remission: Secondary | ICD-10-CM | POA: Diagnosis not present

## 2024-02-12 DIAGNOSIS — F902 Attention-deficit hyperactivity disorder, combined type: Secondary | ICD-10-CM | POA: Diagnosis not present
